# Patient Record
Sex: Female | Born: 1995 | Race: White | Hispanic: No | Marital: Married | Smoking: Never smoker
Health system: Southern US, Community
[De-identification: ages and names within clinical notes are randomized; demographics above are authoritative.]

## PROBLEM LIST (undated history)

## (undated) DIAGNOSIS — R519 Headache, unspecified: Secondary | ICD-10-CM

## (undated) HISTORY — PX: WISDOM TOOTH EXTRACTION: SHX21

---

## 2016-07-13 DIAGNOSIS — O200 Threatened abortion: Secondary | ICD-10-CM | POA: Diagnosis not present

## 2016-08-19 DIAGNOSIS — G43001 Migraine without aura, not intractable, with status migrainosus: Secondary | ICD-10-CM | POA: Insufficient documentation

## 2016-08-30 DIAGNOSIS — Z3689 Encounter for other specified antenatal screening: Secondary | ICD-10-CM | POA: Diagnosis not present

## 2016-10-17 DIAGNOSIS — H5203 Hypermetropia, bilateral: Secondary | ICD-10-CM | POA: Diagnosis not present

## 2018-05-13 DIAGNOSIS — O209 Hemorrhage in early pregnancy, unspecified: Secondary | ICD-10-CM | POA: Diagnosis not present

## 2018-05-13 DIAGNOSIS — Z3A01 Less than 8 weeks gestation of pregnancy: Secondary | ICD-10-CM | POA: Diagnosis not present

## 2018-05-13 DIAGNOSIS — R42 Dizziness and giddiness: Secondary | ICD-10-CM | POA: Diagnosis not present

## 2018-05-13 DIAGNOSIS — R1084 Generalized abdominal pain: Secondary | ICD-10-CM | POA: Diagnosis not present

## 2018-05-13 DIAGNOSIS — Z3201 Encounter for pregnancy test, result positive: Secondary | ICD-10-CM | POA: Diagnosis not present

## 2018-05-13 DIAGNOSIS — O26891 Other specified pregnancy related conditions, first trimester: Secondary | ICD-10-CM | POA: Diagnosis not present

## 2018-05-13 DIAGNOSIS — R5383 Other fatigue: Secondary | ICD-10-CM | POA: Diagnosis not present

## 2018-05-13 DIAGNOSIS — R110 Nausea: Secondary | ICD-10-CM | POA: Diagnosis not present

## 2018-05-13 DIAGNOSIS — O30041 Twin pregnancy, dichorionic/diamniotic, first trimester: Secondary | ICD-10-CM | POA: Diagnosis not present

## 2018-05-14 DIAGNOSIS — O219 Vomiting of pregnancy, unspecified: Secondary | ICD-10-CM | POA: Diagnosis not present

## 2018-05-14 DIAGNOSIS — R5383 Other fatigue: Secondary | ICD-10-CM | POA: Diagnosis not present

## 2018-05-14 DIAGNOSIS — Z3A01 Less than 8 weeks gestation of pregnancy: Secondary | ICD-10-CM | POA: Diagnosis not present

## 2018-05-14 DIAGNOSIS — O30049 Twin pregnancy, dichorionic/diamniotic, unspecified trimester: Secondary | ICD-10-CM | POA: Insufficient documentation

## 2018-05-14 DIAGNOSIS — O30041 Twin pregnancy, dichorionic/diamniotic, first trimester: Secondary | ICD-10-CM | POA: Diagnosis not present

## 2018-05-14 DIAGNOSIS — K5900 Constipation, unspecified: Secondary | ICD-10-CM | POA: Diagnosis not present

## 2018-05-14 DIAGNOSIS — R233 Spontaneous ecchymoses: Secondary | ICD-10-CM | POA: Diagnosis not present

## 2018-05-14 DIAGNOSIS — Z3490 Encounter for supervision of normal pregnancy, unspecified, unspecified trimester: Secondary | ICD-10-CM | POA: Insufficient documentation

## 2018-05-29 DIAGNOSIS — Z01419 Encounter for gynecological examination (general) (routine) without abnormal findings: Secondary | ICD-10-CM | POA: Diagnosis not present

## 2018-05-29 DIAGNOSIS — Z0000 Encounter for general adult medical examination without abnormal findings: Secondary | ICD-10-CM | POA: Diagnosis not present

## 2018-05-29 DIAGNOSIS — Z3A08 8 weeks gestation of pregnancy: Secondary | ICD-10-CM | POA: Diagnosis not present

## 2018-05-29 DIAGNOSIS — Z124 Encounter for screening for malignant neoplasm of cervix: Secondary | ICD-10-CM | POA: Diagnosis not present

## 2018-05-29 DIAGNOSIS — O3680X0 Pregnancy with inconclusive fetal viability, not applicable or unspecified: Secondary | ICD-10-CM | POA: Diagnosis not present

## 2018-05-29 DIAGNOSIS — Z3689 Encounter for other specified antenatal screening: Secondary | ICD-10-CM | POA: Diagnosis not present

## 2018-05-29 DIAGNOSIS — Z113 Encounter for screening for infections with a predominantly sexual mode of transmission: Secondary | ICD-10-CM | POA: Diagnosis not present

## 2018-05-29 DIAGNOSIS — O30041 Twin pregnancy, dichorionic/diamniotic, first trimester: Secondary | ICD-10-CM | POA: Diagnosis not present

## 2018-05-29 LAB — OB RESULTS CONSOLE HIV ANTIBODY (ROUTINE TESTING): HIV: NONREACTIVE

## 2018-05-29 LAB — OB RESULTS CONSOLE GC/CHLAMYDIA
Chlamydia: NEGATIVE
Gonorrhea: NEGATIVE

## 2018-05-29 LAB — OB RESULTS CONSOLE RUBELLA ANTIBODY, IGM: Rubella: IMMUNE

## 2018-05-29 LAB — OB RESULTS CONSOLE HEPATITIS B SURFACE ANTIGEN: Hepatitis B Surface Ag: NEGATIVE

## 2018-05-29 LAB — OB RESULTS CONSOLE ANTIBODY SCREEN: Antibody Screen: NEGATIVE

## 2018-05-29 LAB — OB RESULTS CONSOLE ABO/RH: RH Type: POSITIVE

## 2018-05-29 LAB — OB RESULTS CONSOLE RPR: RPR: NONREACTIVE

## 2018-05-29 LAB — OB RESULTS CONSOLE HGB/HCT, BLOOD: Hemoglobin: 11.2

## 2018-06-04 DIAGNOSIS — O209 Hemorrhage in early pregnancy, unspecified: Secondary | ICD-10-CM | POA: Diagnosis not present

## 2018-06-04 DIAGNOSIS — Z3A10 10 weeks gestation of pregnancy: Secondary | ICD-10-CM | POA: Diagnosis not present

## 2018-06-04 DIAGNOSIS — O200 Threatened abortion: Secondary | ICD-10-CM | POA: Diagnosis not present

## 2018-06-13 DIAGNOSIS — O418X10 Other specified disorders of amniotic fluid and membranes, first trimester, not applicable or unspecified: Secondary | ICD-10-CM | POA: Diagnosis not present

## 2018-06-13 DIAGNOSIS — O30041 Twin pregnancy, dichorionic/diamniotic, first trimester: Secondary | ICD-10-CM | POA: Diagnosis not present

## 2018-06-13 DIAGNOSIS — O418X11 Other specified disorders of amniotic fluid and membranes, first trimester, fetus 1: Secondary | ICD-10-CM | POA: Diagnosis not present

## 2018-06-13 DIAGNOSIS — Z3A10 10 weeks gestation of pregnancy: Secondary | ICD-10-CM | POA: Diagnosis not present

## 2018-06-13 DIAGNOSIS — O3680X1 Pregnancy with inconclusive fetal viability, fetus 1: Secondary | ICD-10-CM | POA: Diagnosis not present

## 2018-06-13 DIAGNOSIS — O3680X0 Pregnancy with inconclusive fetal viability, not applicable or unspecified: Secondary | ICD-10-CM | POA: Diagnosis not present

## 2018-06-13 DIAGNOSIS — O468X1 Other antepartum hemorrhage, first trimester: Secondary | ICD-10-CM | POA: Diagnosis not present

## 2018-06-13 DIAGNOSIS — O4441 Low lying placenta NOS or without hemorrhage, first trimester: Secondary | ICD-10-CM | POA: Diagnosis not present

## 2018-06-26 DIAGNOSIS — O418X11 Other specified disorders of amniotic fluid and membranes, first trimester, fetus 1: Secondary | ICD-10-CM | POA: Diagnosis not present

## 2018-06-26 DIAGNOSIS — O30041 Twin pregnancy, dichorionic/diamniotic, first trimester: Secondary | ICD-10-CM | POA: Diagnosis not present

## 2018-06-26 DIAGNOSIS — O468X1 Other antepartum hemorrhage, first trimester: Secondary | ICD-10-CM | POA: Diagnosis not present

## 2018-06-26 DIAGNOSIS — Z3A12 12 weeks gestation of pregnancy: Secondary | ICD-10-CM | POA: Diagnosis not present

## 2018-06-26 DIAGNOSIS — Z3682 Encounter for antenatal screening for nuchal translucency: Secondary | ICD-10-CM | POA: Diagnosis not present

## 2018-07-25 DIAGNOSIS — M545 Low back pain: Secondary | ICD-10-CM | POA: Diagnosis not present

## 2018-07-25 DIAGNOSIS — Z3689 Encounter for other specified antenatal screening: Secondary | ICD-10-CM | POA: Diagnosis not present

## 2018-07-25 DIAGNOSIS — O30042 Twin pregnancy, dichorionic/diamniotic, second trimester: Secondary | ICD-10-CM | POA: Diagnosis not present

## 2018-07-25 DIAGNOSIS — Z3A17 17 weeks gestation of pregnancy: Secondary | ICD-10-CM | POA: Diagnosis not present

## 2018-08-07 DIAGNOSIS — Z3A18 18 weeks gestation of pregnancy: Secondary | ICD-10-CM | POA: Diagnosis not present

## 2018-08-07 DIAGNOSIS — O30042 Twin pregnancy, dichorionic/diamniotic, second trimester: Secondary | ICD-10-CM | POA: Diagnosis not present

## 2018-08-07 DIAGNOSIS — R310 Gross hematuria: Secondary | ICD-10-CM | POA: Diagnosis not present

## 2018-08-07 DIAGNOSIS — O9989 Other specified diseases and conditions complicating pregnancy, childbirth and the puerperium: Secondary | ICD-10-CM | POA: Diagnosis not present

## 2018-08-07 DIAGNOSIS — Z3689 Encounter for other specified antenatal screening: Secondary | ICD-10-CM | POA: Diagnosis not present

## 2018-08-24 ENCOUNTER — Encounter: Payer: Self-pay | Admitting: Family Medicine

## 2018-08-24 ENCOUNTER — Ambulatory Visit (INDEPENDENT_AMBULATORY_CARE_PROVIDER_SITE_OTHER): Payer: Medicaid Other | Admitting: Family Medicine

## 2018-08-24 ENCOUNTER — Other Ambulatory Visit: Payer: Self-pay

## 2018-08-24 VITALS — BP 113/60 | HR 97 | Ht 65.0 in | Wt 146.0 lb

## 2018-08-24 DIAGNOSIS — O30042 Twin pregnancy, dichorionic/diamniotic, second trimester: Secondary | ICD-10-CM

## 2018-08-24 DIAGNOSIS — Z3A21 21 weeks gestation of pregnancy: Secondary | ICD-10-CM

## 2018-08-24 DIAGNOSIS — O0990 Supervision of high risk pregnancy, unspecified, unspecified trimester: Secondary | ICD-10-CM

## 2018-08-24 DIAGNOSIS — O30049 Twin pregnancy, dichorionic/diamniotic, unspecified trimester: Secondary | ICD-10-CM

## 2018-08-24 MED ORDER — ASPIRIN EC 81 MG PO TBEC: 81.0000 mg | DELAYED_RELEASE_TABLET | Freq: Every day | ORAL | 2 refills | Status: DC

## 2018-08-24 NOTE — Patient Instructions (Addendum)
Dietary Recommendations in Pregnancy:      Twins:      Non-pregnant Single Baby  Underweight (BMI < 19.8) Normal Weight (BMI 20 -26) Overweight (BMI 26-29) Obese (BMI >29)  (BMI >29)         Calories (Kcal) 2200 2500  4000 3500 3250 3000  Protein (g) 110 126  200 175 163 150  Carbohydrate (g) 220 248  400 350 325 300  Fat (g) 98 112  178 156 144 133       Twin Dietary Recommendations:  . Eat every 3-4 hours: at least 3 meals plus 2-3 snacks per day . Iron supplement twice a day and lean protein: fish, poultry, dairy, nuts . Complex carbohydrates: fruits, legumes, and whole grains . Daily mineral supplements: Calcium 3g, Magnesium 1.2g, Zinc 45mg . Healthy fats: mono and poly unsaturated (olive oil, canola oil, nuts, seeds). Limit saturated fats and trans fats . Omega-3 fatty acid rich fish protein 2-4x per week . BMI specific dietary recommendations for calories and protein  

## 2018-08-24 NOTE — Progress Notes (Signed)
Subjective:  Danielle Crawford is a G2P1001 640w0d being seen today for her first obstetrical visit in our office. She transferred care from [facility]. Her first pregnancy and labor was uncomplicated - she delivered at 39 weeks at [facility] without complications. She is currently expecting twins. The first part of her pregnancy was complicated by subchorionic hematoma. FOB involved and is patient's husband. Patient does intend to breast feed. Pregnancy history fully reviewed.  Patient reports no complaints.  BP 113/60   Pulse 97   Ht 5\' 5"  (1.651 m)   Wt 146 lb (66.2 kg)   LMP 03/30/2018 (Exact Date)   BMI 24.30 kg/m   HISTORY: OB History  Gravida Para Term Preterm AB Living  2 1 1     1   SAB TAB Ectopic Multiple Live Births        1 1    # Outcome Date GA Lbr Len/2nd Weight Sex Delivery Anes PTL Lv  2 Current           1 Term 02/19/17 7880w0d  8 lb 3 oz (3.714 kg) M    LIV    History reviewed. No pertinent past medical history.  History reviewed. No pertinent surgical history.  Family History  Problem Relation Age of Onset  . Cancer Other        breast- great grandmother  . Diabetes Neg Hx   . Hypertension Neg Hx      Exam  BP 113/60   Pulse 97   Ht 5\' 5"  (1.651 m)   Wt 146 lb (66.2 kg)   LMP 03/30/2018 (Exact Date)   BMI 24.30 kg/m   CONSTITUTIONAL: Well-developed, well-nourished female in no acute distress.  HENT:  Normocephalic, atraumatic, External right and left ear normal. Oropharynx is clear and moist EYES: Conjunctivae and EOM are normal. Pupils are equal, round, and reactive to light. No scleral icterus.  NECK: Normal range of motion, supple, no masses.  Normal thyroid.  CARDIOVASCULAR: Normal heart rate noted, regular rhythm RESPIRATORY: Clear to auscultation bilaterally. Effort and breath sounds normal, no problems with respiration noted. ABDOMEN: Soft, normal bowel sounds, no distention noted.  No tenderness, rebound or guarding.   MUSCULOSKELETAL: Normal range of motion. No tenderness.  No cyanosis, clubbing, or edema.  2+ distal pulses. SKIN: Skin is warm and dry. No rash noted. Not diaphoretic. No erythema. No pallor. NEUROLOGIC: Alert and oriented to person, place, and time. Normal reflexes, muscle tone coordination. No cranial nerve deficit noted. PSYCHIATRIC: Normal mood and affect. Normal behavior. Normal judgment and thought content.    Assessment:    Pregnancy: G2P1001 Patient Active Problem List   Diagnosis Date Noted  . Dichorionic diamniotic twin pregnancy, antepartum 08/24/2018  . Supervision of high risk pregnancy, antepartum 08/24/2018      Plan:   1. Supervision of high risk pregnancy, antepartum FHT and FH normal Discussed nature of practice, call schedule, etc Discussed use of midwives and fellows. Low risk NIPS Reviewed PNL - normal  2. Dichorionic diamniotic twin pregnancy, antepartum Start ASA 81mg  Discussed nutrition in twin pregnancy: 3500kcal per day with about 175g protein.  Will schedule US. Delivery of uncomplicated Di/di twins between 37-38 weeks discussed, as well as antenatal testing starting at 35 weeks.  Patient desirous of minimal invasive labor - c/s as last option, delayed cord clamping, skin to skin, etc. I discussed with her that there are many of our OB providers who are comfortable with breech extraction as alternative to  cesarean delivery for breech or transverse baby B.  - [place] MFM OB DETAIL +14 WK; Future - [place] MFM OB DETAIL ADDL GEST +14 WK; Future      Problem list reviewed and updated. 75% of 45 min visit spent on counseling and coordination of care.     Levie Heritage 08/24/2018

## 2018-08-29 ENCOUNTER — Encounter (HOSPITAL_COMMUNITY): Payer: Self-pay

## 2018-08-29 ENCOUNTER — Ambulatory Visit (HOSPITAL_COMMUNITY): Payer: Medicaid Other | Admitting: *Deleted

## 2018-08-29 ENCOUNTER — Ambulatory Visit (HOSPITAL_COMMUNITY)
Admission: RE | Admit: 2018-08-29 | Discharge: 2018-08-29 | Disposition: A | Discharge status: Home / Self Care | Payer: Medicaid Other | Source: Ambulatory Visit | Attending: Obstetrics and Gynecology | Admitting: Obstetrics and Gynecology

## 2018-08-29 ENCOUNTER — Other Ambulatory Visit: Payer: Self-pay

## 2018-08-29 VITALS — BP 96/60 | HR 83 | Temp 97.8°F

## 2018-08-29 DIAGNOSIS — Z363 Encounter for antenatal screening for malformations: Secondary | ICD-10-CM | POA: Diagnosis not present

## 2018-08-29 DIAGNOSIS — Z3A21 21 weeks gestation of pregnancy: Secondary | ICD-10-CM

## 2018-08-29 DIAGNOSIS — O30049 Twin pregnancy, dichorionic/diamniotic, unspecified trimester: Secondary | ICD-10-CM

## 2018-08-29 DIAGNOSIS — O30042 Twin pregnancy, dichorionic/diamniotic, second trimester: Secondary | ICD-10-CM | POA: Diagnosis not present

## 2018-08-30 ENCOUNTER — Encounter: Payer: Self-pay | Admitting: Advanced Practice Midwife

## 2018-08-30 ENCOUNTER — Other Ambulatory Visit (HOSPITAL_COMMUNITY): Payer: Self-pay | Admitting: *Deleted

## 2018-08-30 DIAGNOSIS — Z362 Encounter for other antenatal screening follow-up: Secondary | ICD-10-CM

## 2018-08-30 DIAGNOSIS — O30042 Twin pregnancy, dichorionic/diamniotic, second trimester: Secondary | ICD-10-CM

## 2018-09-05 ENCOUNTER — Encounter: Payer: Self-pay | Admitting: Obstetrics & Gynecology

## 2018-09-25 ENCOUNTER — Ambulatory Visit (INDEPENDENT_AMBULATORY_CARE_PROVIDER_SITE_OTHER): Payer: Medicaid Other | Admitting: Advanced Practice Midwife

## 2018-09-25 ENCOUNTER — Other Ambulatory Visit: Payer: Self-pay

## 2018-09-25 ENCOUNTER — Ambulatory Visit (HOSPITAL_COMMUNITY): Payer: Medicaid Other | Admitting: *Deleted

## 2018-09-25 ENCOUNTER — Ambulatory Visit (HOSPITAL_COMMUNITY)
Admission: RE | Admit: 2018-09-25 | Discharge: 2018-09-25 | Disposition: A | Discharge status: Home / Self Care | Payer: Medicaid Other | Source: Ambulatory Visit | Attending: Obstetrics and Gynecology | Admitting: Obstetrics and Gynecology

## 2018-09-25 ENCOUNTER — Encounter (HOSPITAL_COMMUNITY): Payer: Self-pay | Admitting: *Deleted

## 2018-09-25 ENCOUNTER — Encounter: Payer: Self-pay | Admitting: Advanced Practice Midwife

## 2018-09-25 ENCOUNTER — Other Ambulatory Visit (HOSPITAL_COMMUNITY): Payer: Self-pay | Admitting: *Deleted

## 2018-09-25 VITALS — BP 100/56 | HR 72 | Temp 98.5°F

## 2018-09-25 DIAGNOSIS — O30049 Twin pregnancy, dichorionic/diamniotic, unspecified trimester: Secondary | ICD-10-CM | POA: Insufficient documentation

## 2018-09-25 DIAGNOSIS — O30042 Twin pregnancy, dichorionic/diamniotic, second trimester: Secondary | ICD-10-CM | POA: Insufficient documentation

## 2018-09-25 DIAGNOSIS — Z3A25 25 weeks gestation of pregnancy: Secondary | ICD-10-CM | POA: Diagnosis not present

## 2018-09-25 DIAGNOSIS — Z362 Encounter for other antenatal screening follow-up: Secondary | ICD-10-CM | POA: Diagnosis not present

## 2018-09-25 DIAGNOSIS — O30043 Twin pregnancy, dichorionic/diamniotic, third trimester: Secondary | ICD-10-CM

## 2018-09-25 DIAGNOSIS — O0992 Supervision of high risk pregnancy, unspecified, second trimester: Secondary | ICD-10-CM

## 2018-09-25 DIAGNOSIS — O0990 Supervision of high risk pregnancy, unspecified, unspecified trimester: Secondary | ICD-10-CM

## 2018-09-25 NOTE — Progress Notes (Deleted)
  Subjective:    Danielle Crawford is being seen today for her first obstetrical visit.  This {is/is not:9024} a planned pregnancy. She is at [redacted]w[redacted]d gestation. Her obstetrical history is significant for {ob risk factors:10154}. Relationship with FOB: {fob:16621}. Patient {does/does not:19097} intend to breast feed. Pregnancy history fully reviewed.  Patient reports {sx:14538}.  Review of Systems:   Review of Systems  Objective:     BP (!) 107/54   Pulse 94   Wt 69 kg   LMP 03/30/2018 (Exact Date)   BMI 25.31 kg/m  Physical Exam  Exam    Assessment:    Pregnancy: G2P1001 Patient Active Problem List   Diagnosis Date Noted  . Dichorionic diamniotic twin pregnancy, antepartum 08/24/2018  . Supervision of high risk pregnancy, antepartum 08/24/2018       Plan:     Initial labs drawn. Prenatal vitamins. Problem list reviewed and updated. AFP3 discussed: {requests/ordered/declines:14581}. Role of ultrasound in pregnancy discussed; fetal survey: {requests/ordered/declines:14581}. Amniocentesis discussed: {amniocentesis:14582}. Follow up in {numbers 0-4:31231} weeks. ***% of *** min visit spent on counseling and coordination of care.  ***   Hansel Feinstein 09/25/2018

## 2018-09-25 NOTE — Progress Notes (Deleted)
  Subjective:    Danielle Crawford is being seen today for her first obstetrical visit.  This {is/is not:9024} a planned pregnancy. She is at [redacted]w[redacted]d gestation. Her obstetrical history is significant for {ob risk factors:10154}. Relationship with FOB: {fob:16621}. Patient {does/does not:19097} intend to breast feed. Pregnancy history fully reviewed.  Patient reports {sx:14538}.  Review of Systems:   Review of Systems  Objective:     BP (!) 107/54   Pulse 94   Wt 69 kg   LMP 03/30/2018 (Exact Date)   BMI 25.31 kg/m  Physical Exam  Exam    Assessment:    Pregnancy: G2P1001 Patient Active Problem List   Diagnosis Date Noted  . Dichorionic diamniotic twin pregnancy, antepartum 08/24/2018  . Supervision of high risk pregnancy, antepartum 08/24/2018       Plan:     Initial labs drawn. Prenatal vitamins. Problem list reviewed and updated. AFP3 discussed: {requests/ordered/declines:14581}. Role of ultrasound in pregnancy discussed; fetal survey: {requests/ordered/declines:14581}. Amniocentesis discussed: {amniocentesis:14582}. Follow up in {numbers 0-4:31231} weeks. ***% of *** min visit spent on counseling and coordination of care.  ***   Marie Williams 09/25/2018   

## 2018-09-25 NOTE — Patient Instructions (Signed)
Glucose Tolerance Test During Pregnancy Why am I having this test? The glucose tolerance test (GTT) is done to check how your body processes sugar (glucose). This is one of several tests used to diagnose diabetes that develops during pregnancy (gestational diabetes mellitus). Gestational diabetes is a temporary form of diabetes that some women develop during pregnancy. It usually occurs during the second trimester of pregnancy and goes away after delivery. Testing (screening) for gestational diabetes usually occurs between 24 and 28 weeks of pregnancy. You may have the GTT test after having a 1-hour glucose screening test if the results from that test indicate that you may have gestational diabetes. You may also have this test if:  You have a history of gestational diabetes.  You have a history of giving birth to very large babies or have experienced repeated fetal loss (stillbirth).  You have signs and symptoms of diabetes, such as: ? Changes in your vision. ? Tingling or numbness in your hands or feet. ? Changes in hunger, thirst, and urination that are not otherwise explained by your pregnancy. What is being tested? This test measures the amount of glucose in your blood at different times during a period of 3 hours. This indicates how well your body is able to process glucose. What kind of sample is taken?  Blood samples are required for this test. They are usually collected by inserting a needle into a blood vessel. How do I prepare for this test?  For 3 days before your test, eat normally. Have plenty of carbohydrate-rich foods.  Follow instructions from your health care provider about: ? Eating or drinking restrictions on the day of the test. You may be asked to not eat or drink anything other than water (fast) starting 8-10 hours before the test. ? Changing or stopping your regular medicines. Some medicines may interfere with this test. Tell a health care provider about:  All  medicines you are taking, including vitamins, herbs, eye drops, creams, and over-the-counter medicines.  Any blood disorders you have.  Any surgeries you have had.  Any medical conditions you have. What happens during the test? First, your blood glucose will be measured. This is referred to as your fasting blood glucose, since you fasted before the test. Then, you will drink a glucose solution that contains a certain amount of glucose. Your blood glucose will be measured again 1, 2, and 3 hours after drinking the solution. This test takes about 3 hours to complete. You will need to stay at the testing location during this time. During the testing period:  Do not eat or drink anything other than the glucose solution.  Do not exercise.  Do not use any products that contain nicotine or tobacco, such as cigarettes and e-cigarettes. If you need help stopping, ask your health care provider. The testing procedure may vary among health care providers and hospitals. How are the results reported? Your results will be reported as milligrams of glucose per deciliter of blood (mg/dL) or millimoles per liter (mmol/L). Your health care provider will compare your results to normal ranges that were established after testing a large group of people (reference ranges). Reference ranges may vary among labs and hospitals. For this test, common reference ranges are:  Fasting: less than 95-105 mg/dL (5.3-5.8 mmol/L).  1 hour after drinking glucose: less than 180-190 mg/dL (10.0-10.5 mmol/L).  2 hours after drinking glucose: less than 155-165 mg/dL (8.6-9.2 mmol/L).  3 hours after drinking glucose: 140-145 mg/dL (7.8-8.1 mmol/L). What do the   results mean? Results within reference ranges are considered normal, meaning that your glucose levels are well-controlled. If two or more of your blood glucose levels are high, you may be diagnosed with gestational diabetes. If only one level is high, your health care  provider may suggest repeat testing or other tests to confirm a diagnosis. Talk with your health care provider about what your results mean. Questions to ask your health care provider Ask your health care provider, or the department that is doing the test:  When will my results be ready?  How will I get my results?  What are my treatment options?  What other tests do I need?  What are my next steps? Summary  The glucose tolerance test (GTT) is one of several tests used to diagnose diabetes that develops during pregnancy (gestational diabetes mellitus). Gestational diabetes is a temporary form of diabetes that some women develop during pregnancy.  You may have the GTT test after having a 1-hour glucose screening test if the results from that test indicate that you may have gestational diabetes. You may also have this test if you have any symptoms or risk factors for gestational diabetes.  Talk with your health care provider about what your results mean. This information is not intended to replace advice given to you by your health care provider. Make sure you discuss any questions you have with your health care provider. Document Released: 09/06/2011 Document Revised: 06/28/2018 Document Reviewed: 10/17/2016 Elsevier Patient Education  2020 Elsevier Inc.  

## 2018-09-26 ENCOUNTER — Other Ambulatory Visit (HOSPITAL_COMMUNITY): Payer: Self-pay | Admitting: *Deleted

## 2018-09-26 DIAGNOSIS — O30049 Twin pregnancy, dichorionic/diamniotic, unspecified trimester: Secondary | ICD-10-CM

## 2018-09-27 ENCOUNTER — Encounter: Payer: Self-pay | Admitting: Advanced Practice Midwife

## 2018-09-27 NOTE — Progress Notes (Deleted)
  Subjective:    Danielle Crawford is being seen today for her first obstetrical visit.  This {is/is not:9024} a planned pregnancy. She is at [redacted]w[redacted]d gestation. Her obstetrical history is significant for {ob risk factors:10154}. Relationship with FOB: {fob:16621}. Patient {does/does not:19097} intend to breast feed. Pregnancy history fully reviewed.  Patient reports {sx:14538}.  Review of Systems:   Review of Systems  Objective:     BP (!) 107/54   Pulse 94   Wt 69 kg   LMP 03/30/2018 (Exact Date)   BMI 25.31 kg/m  Physical Exam  Exam    Assessment:    Pregnancy: G2P1001 Patient Active Problem List   Diagnosis Date Noted  . Dichorionic diamniotic twin gestation 05/14/2018  . Pregnancy 05/14/2018  . Migraine without aura and with status migrainosus, not intractable 08/19/2016       Plan:     Initial labs drawn. Prenatal vitamins. Problem list reviewed and updated. AFP3 discussed: {requests/ordered/declines:14581}. Role of ultrasound in pregnancy discussed; fetal survey: {requests/ordered/declines:14581}. Amniocentesis discussed: {amniocentesis:14582}. Follow up in {numbers 0-4:31231} weeks. ***% of *** min visit spent on counseling and coordination of care.  ***   Hansel Feinstein 09/27/2018

## 2018-09-27 NOTE — Progress Notes (Signed)
   PRENATAL VISIT NOTE  Subjective:  Danielle Crawford is a 23 y.o. G2P1001 at [redacted]w[redacted]d being seen today for ongoing prenatal care.  She is currently monitored for the following issues for this high-risk pregnancy and has Dichorionic diamniotic twin gestation; Pregnancy; and Migraine without aura and with status migrainosus, not intractable on their problem list.  Patient reports no complaints.  Contractions: Not present. Vag. Bleeding: None.  Movement: Present. Denies leaking of fluid.   The following portions of the patient's history were reviewed and updated as appropriate: allergies, current medications, past family history, past medical history, past social history, past surgical history and problem list.   Objective:   Vitals:   09/25/18 0839  BP: (!) 107/54  Pulse: 94  Weight: 69 kg    Fetal Status: Fetal Heart Rate (bpm): 133/145 Fundal Height: 34 cm Movement: Present     General:  Alert, oriented and cooperative. Patient is in no acute distress.  Skin: Skin is warm and dry. No rash noted.   Cardiovascular: Normal heart rate noted  Respiratory: Normal respiratory effort, no problems with respiration noted  Abdomen: Soft, gravid, appropriate for gestational age.  Pain/Pressure: Present     Pelvic: Cervical exam deferred        Extremities: Normal range of motion.  Edema: None  Mental Status: Normal mood and affect. Normal behavior. Normal judgment and thought content.   Assessment and Plan:  Pregnancy: G2P1001 at [redacted]w[redacted]d 1. Supervision of high risk pregnancy, antepartum    Reviewed signs of preterm labor, PPROM and bleeding.  Reviewed Fetal movement   Reviewed how to go to MAU    Has [place] scheduled today    Can't stay for glucola today due to work     Will reschedule this week or next  Preterm labor symptoms and general obstetric precautions including but not limited to vaginal bleeding, contractions, leaking of fluid and fetal movement were reviewed in detail with the  patient. Please refer to After Visit Summary for other counseling recommendations.   Return for [facility].  Future Appointments  Date Time Provider [place]  10/24/2018  3:45 PM Lavonia Drafts, MD [facility] None  10/30/2018  1:00 PM [place] [facility]  10/30/2018  1:00 PM [place] 3 [facility]  11/27/2018  1:00 PM [place] [facility]  11/27/2018  1:00 PM [place] 3 [facility]    Hansel Feinstein, CNM

## 2018-10-04 ENCOUNTER — Encounter (HOSPITAL_COMMUNITY): Payer: Self-pay

## 2018-10-04 ENCOUNTER — Inpatient Hospital Stay (HOSPITAL_BASED_OUTPATIENT_CLINIC_OR_DEPARTMENT_OTHER): Payer: Medicaid Other

## 2018-10-04 ENCOUNTER — Other Ambulatory Visit: Payer: Self-pay

## 2018-10-04 ENCOUNTER — Inpatient Hospital Stay (HOSPITAL_COMMUNITY)
Admission: AD | Admit: 2018-10-04 | Discharge: 2018-10-04 | Disposition: A | Discharge status: Home / Self Care | Payer: Medicaid Other | Attending: Obstetrics and Gynecology | Admitting: Obstetrics and Gynecology

## 2018-10-04 DIAGNOSIS — Z7982 Long term (current) use of aspirin: Secondary | ICD-10-CM | POA: Insufficient documentation

## 2018-10-04 DIAGNOSIS — R109 Unspecified abdominal pain: Secondary | ICD-10-CM | POA: Insufficient documentation

## 2018-10-04 DIAGNOSIS — Z3A27 27 weeks gestation of pregnancy: Secondary | ICD-10-CM | POA: Diagnosis not present

## 2018-10-04 DIAGNOSIS — Z3A26 26 weeks gestation of pregnancy: Secondary | ICD-10-CM

## 2018-10-04 DIAGNOSIS — O368120 Decreased fetal movements, second trimester, not applicable or unspecified: Secondary | ICD-10-CM

## 2018-10-04 DIAGNOSIS — R1030 Lower abdominal pain, unspecified: Secondary | ICD-10-CM | POA: Diagnosis not present

## 2018-10-04 DIAGNOSIS — O30042 Twin pregnancy, dichorionic/diamniotic, second trimester: Secondary | ICD-10-CM | POA: Insufficient documentation

## 2018-10-04 DIAGNOSIS — O26892 Other specified pregnancy related conditions, second trimester: Secondary | ICD-10-CM

## 2018-10-04 HISTORY — DX: Headache, unspecified: R51.9

## 2018-10-04 LAB — URINALYSIS, ROUTINE W REFLEX MICROSCOPIC
Bilirubin Urine: NEGATIVE
Glucose, UA: NEGATIVE mg/dL
Hgb urine dipstick: NEGATIVE
Ketones, ur: NEGATIVE mg/dL
Nitrite: NEGATIVE
Protein, ur: NEGATIVE mg/dL
Specific Gravity, Urine: 1.008 (ref 1.005–1.030)
pH: 7 (ref 5.0–8.0)

## 2018-10-04 NOTE — MAU Note (Signed)
PT c/o of sharp pains in her abdomen when moving. Also having BH cntx and feels decreased fetal movement. Has no LOF or VB.  Has [employer] twin pregnancy.

## 2018-10-04 NOTE — MAU Note (Signed)
Pt had U/S d/t decreased FM. But these babies were  very difficult to trace d/t fetal movement. Was unable to get 20 min NST because patient had to leave to pick up her child.  Provider aware and discharged patient with labor precautions.

## 2018-10-04 NOTE — MAU Provider Note (Addendum)
Patient Danielle Crawford is a 23 y.o. G2P1001 At 5024w0d here with complaints of sharp pains in her suprapubic region since yesterday and and decreased fetal movement. She can't tell if she is having contractions or just gas pain. She denies VB, abnormal vaginal discharge, dysuria, flank pain,  LOF, headache, blurry vision, SOB, fever, or other ob-gyn complaints. She is pregnant with Di-Di twins; so far, she has had no complications with her twins. [facility] .    Her next prenatal visit is the 11th of August; History     CSN: [id number]  Arrival date and time: 10/04/18 1428   None     Chief Complaint  Patient presents with  . Contractions   Abdominal Pain This is a new problem. The current episode started yesterday. The onset quality is sudden. The pain is located in the suprapubic region. The pain is at a severity of 3/10. The quality of the pain is sharp. The abdominal pain does not radiate. Associated symptoms include nausea. Pertinent negatives include no constipation or diarrhea.  The pain gets better when she stands up and stretches. In addition, she feels like baby A has been moving less; she feels them "balling up" and can't tell if that is normal.   OB History    Gravida  2   Para  1   Term  1   Preterm      AB      Living  1     SAB      TAB      Ectopic      Multiple  1   Live Births  1           Past Medical History:  Diagnosis Date  . Headache     Past Surgical History:  Procedure Laterality Date  . WISDOM TOOTH EXTRACTION      Family History  Problem Relation Age of Onset  . Cancer Other        breast- great grandmother  . Diabetes Neg Hx   . Hypertension Neg Hx     Social History   Tobacco Use  . Smoking status: Never Smoker  . Smokeless tobacco: Never Used  Substance Use Topics  . Alcohol use: Never    Frequency: Never  . Drug use: Never    Allergies: No Known Allergies  Medications Prior to  Admission  Medication Sig Dispense Refill Last Dose  . aspirin EC 81 MG tablet Take 1 tablet (81 mg total) by mouth daily. Take after 12 weeks for prevention of preeclampsia later in pregnancy 300 tablet 2 10/04/2018 at Unknown time  . Prenatal Vit w/Fe-Methylfol-FA (PNV PO) Take by mouth.   10/04/2018 at Unknown time  . DICLEGIS 10-10 MG TBEC TAKE 1 TABLET EVERY MORNING MAY TAKE 1 ADDITIONAL TAB AT LUNCH IF NEEDED AND 2 TABS AT BEDTIME    at not taking  . Doxylamine-Pyridoxine 10-10 MG TBEC Take by mouth.    at not taking  . Prenat w/o A Vit-FeFum-FePo-FA (CONCEPT OB) 130-92.4-1 MG CAPS Take 1 tablet by mouth daily.     . Prenatal Vit-Fe Fumarate-FA (PRENATAL VITAMIN) 27-0.8 MG TABS Take by mouth.       Review of Systems  Constitutional: Negative.   HENT: Negative.   Eyes: Negative.   Respiratory: Negative.   Gastrointestinal: Positive for abdominal pain and nausea. Negative for constipation and diarrhea.  Endocrine: Negative.   Genitourinary: Negative for difficulty urinating.  Neurological: Negative.  Physical Exam   Blood pressure (!) 88/48, pulse 92, temperature 97.6 F (36.4 C), temperature source Oral, resp. rate 16, height 5\' 5"  (1.651 m), weight 71.4 kg, last menstrual period 03/30/2018, SpO2 98 %.  Physical Exam  Constitutional: She appears well-developed and well-nourished.  HENT:  Head: Normocephalic.  Neck: Normal range of motion.  GI: Soft.  Musculoskeletal: Normal range of motion.  Neurological: She is alert.  Skin: Skin is warm.    MAU Course  Procedures  MDM -Limited US shows active fetal movements with normal amniotic fluid, Baby A HR of 144 and Baby B heart rate above 160 -UA for culture, although unlikely she has UTI.  -Patient declines wet prep/GC  and vaginal exam  NST; Baby A HR of 155, Baby B heart rate of 145; present acel, neg decels, no contractions, moderate variability.  Assessment and Plan   1. Pregnancy with suprapubic pain, antepartum     2. Urine for culture, although explained that her suprapubic pain sounds MSK due to increasing gestation and twin pregnancy.   3. Return precautions given; keep follow up appts at [facility].   4. Reviewed fetal movements may be different at this gestation and they may change as babies shift position. Please return to MAU if she feels baby's are not moving like normal.   5. Patient desires discharge as soon as possible to pick up her son and so declined VE or vaginal swabs.   Mervyn Skeeters Abdulrahim Siddiqi 10/04/2018, 4:33 PM

## 2018-10-04 NOTE — Discharge Instructions (Signed)
Pregnancy and Urinary Tract Infection ° °A urinary tract infection (UTI) is an infection of any part of the urinary tract. This includes the kidneys, the tubes that connect your kidneys to your bladder (ureters), the bladder, and the tube that carries urine out of your body (urethra). These organs make, store, and get rid of urine in the body. Your health care provider may use other names to describe the infection. An upper UTI affects the ureters and kidneys (pyelonephritis). A lower UTI affects the bladder (cystitis) and urethra (urethritis). °Most urinary tract infections are caused by bacteria in your genital area, around the entrance to your urinary tract (urethra). These bacteria grow and cause irritation and inflammation of your urinary tract. You are more likely to develop a UTI during pregnancy because the physical and hormonal changes your body goes through can make it easier for bacteria to get into your urinary tract. Your growing baby also puts pressure on your bladder and can affect urine flow. It is important to recognize and treat UTIs in pregnancy because of the risk of serious complications for both you and your baby. °How does this affect me? °Symptoms of a UTI include: °· Needing to urinate right away (urgently). °· Frequent urination or passing small amounts of urine frequently. °· Pain or burning with urination. °· Blood in the urine. °· Urine that smells bad or unusual. °· Trouble urinating. °· Cloudy urine. °· Pain in the abdomen or lower back. °· Vaginal discharge. °You may also have: °· Vomiting or a decreased appetite. °· Confusion. °· Irritability or tiredness. °· A fever. °· Diarrhea. °How does this affect my baby? °An untreated UTI during pregnancy could lead to a kidney infection or a systemic infection, which can cause health problems that could affect your baby. Possible complications of an untreated UTI include: °· Giving birth to your baby before 37 weeks of pregnancy  (premature). °· Having a baby with a low birth weight. °· Developing high blood pressure during pregnancy (preeclampsia). °· Having a low hemoglobin level (anemia). °What can I do to lower my risk? °To prevent a UTI: °· Go to the bathroom as soon as you feel the need. Do not hold urine for long periods of time. °· Always wipe from front to back, especially after a bowel movement. Use each tissue one time when you wipe. °· Empty your bladder after sex. °· Keep your genital area dry. °· Drink 6-10 glasses of water each day. °· Do not douche or use deodorant sprays. °How is this treated? °Treatment for this condition may include: °· Antibiotic medicines that are safe to take during pregnancy. °· Other medicines to treat less common causes of UTI. °Follow these instructions at home: °· If you were prescribed an antibiotic medicine, take it as told by your health care provider. Do not stop using the antibiotic even if you start to feel better. °· Keep all follow-up visits as told by your health care provider. This is important. °Contact a health care provider if: °· Your symptoms do not improve or they get worse. °· You have abnormal vaginal discharge. °Get help right away if you: °· Have a fever. °· Have nausea and vomiting. °· Have back or side pain. °· Feel contractions in your uterus. °· Have lower belly pain. °· Have a gush of fluid from your vagina. °· Have blood in your urine. °Summary °· A urinary tract infection (UTI) is an infection of any part of the urinary tract, which includes the   kidneys, ureters, bladder, and urethra. °· Most urinary tract infections are caused by bacteria in your genital area, around the entrance to your urinary tract (urethra). °· You are more likely to develop a UTI during pregnancy. °· If you were prescribed an antibiotic medicine, take it as told by your health care provider. Do not stop using the antibiotic even if you start to feel better. °This information is not intended to  replace advice given to you by your health care provider. Make sure you discuss any questions you have with your health care provider. °Document Released: 07/02/2010 Document Revised: 06/29/2018 Document Reviewed: 02/08/2018 °Elsevier Patient Education © 2020 [employer] Inc. ° °

## 2018-10-06 LAB — CULTURE, OB URINE: Culture: NO GROWTH

## 2018-10-12 ENCOUNTER — Other Ambulatory Visit: Payer: Medicaid Other

## 2018-10-12 ENCOUNTER — Other Ambulatory Visit: Payer: Self-pay

## 2018-10-12 DIAGNOSIS — O30049 Twin pregnancy, dichorionic/diamniotic, unspecified trimester: Secondary | ICD-10-CM | POA: Diagnosis not present

## 2018-10-12 NOTE — Progress Notes (Signed)
Patient completing 28 week labs- did jelly bean glucose testing. Kathrene Alu RN Tdap offered- patient refused.

## 2018-10-13 LAB — CBC

## 2018-10-13 LAB — GLUCOSE TOLERANCE, 1 HOUR: Glucose, 1Hr PP: 89 mg/dL (ref 65–199)

## 2018-10-13 LAB — RPR: RPR Ser Ql: NONREACTIVE

## 2018-10-13 LAB — HIV ANTIBODY (ROUTINE TESTING W REFLEX): HIV Screen 4th Generation wRfx: NONREACTIVE

## 2018-10-24 ENCOUNTER — Encounter: Payer: Medicaid Other | Admitting: Obstetrics & Gynecology

## 2018-10-30 ENCOUNTER — Ambulatory Visit (HOSPITAL_COMMUNITY): Payer: Medicaid Other

## 2018-10-30 ENCOUNTER — Encounter (HOSPITAL_COMMUNITY): Payer: Self-pay

## 2018-10-30 ENCOUNTER — Other Ambulatory Visit: Payer: Self-pay

## 2018-10-30 ENCOUNTER — Ambulatory Visit (INDEPENDENT_AMBULATORY_CARE_PROVIDER_SITE_OTHER): Payer: Medicaid Other | Admitting: Advanced Practice Midwife

## 2018-10-30 ENCOUNTER — Encounter: Payer: Self-pay | Admitting: Advanced Practice Midwife

## 2018-10-30 ENCOUNTER — Ambulatory Visit (HOSPITAL_COMMUNITY)
Admission: RE | Admit: 2018-10-30 | Discharge: 2018-10-30 | Disposition: A | Discharge status: Home / Self Care | Payer: Medicaid Other | Source: Ambulatory Visit | Attending: Obstetrics and Gynecology | Admitting: Obstetrics and Gynecology

## 2018-10-30 ENCOUNTER — Ambulatory Visit (HOSPITAL_COMMUNITY): Payer: Medicaid Other | Admitting: *Deleted

## 2018-10-30 VITALS — BP 110/69 | HR 88 | Temp 98.4°F

## 2018-10-30 VITALS — BP 103/64 | HR 70 | Wt 161.0 lb

## 2018-10-30 DIAGNOSIS — O0990 Supervision of high risk pregnancy, unspecified, unspecified trimester: Secondary | ICD-10-CM

## 2018-10-30 DIAGNOSIS — O0993 Supervision of high risk pregnancy, unspecified, third trimester: Secondary | ICD-10-CM

## 2018-10-30 DIAGNOSIS — Z362 Encounter for other antenatal screening follow-up: Secondary | ICD-10-CM

## 2018-10-30 DIAGNOSIS — O30043 Twin pregnancy, dichorionic/diamniotic, third trimester: Secondary | ICD-10-CM

## 2018-10-30 DIAGNOSIS — Z3A30 30 weeks gestation of pregnancy: Secondary | ICD-10-CM

## 2018-10-30 DIAGNOSIS — O30049 Twin pregnancy, dichorionic/diamniotic, unspecified trimester: Secondary | ICD-10-CM

## 2018-10-30 NOTE — Patient Instructions (Signed)
Preterm Labor and Birth Information Pregnancy normally lasts 39-41 weeks. Preterm labor is when labor starts early. It starts before you have been pregnant for 37 whole weeks. What are the risk factors for preterm labor? Preterm labor is more likely to occur in women who:  Have an infection while pregnant.  Have a cervix that is short.  Have gone into preterm labor before.  Have had surgery on their cervix.  Are younger than age 22.  Are older than age 68.  Are African American.  Are pregnant with two or more babies.  Take street drugs while pregnant.  Smoke while pregnant.  Do not gain enough weight while pregnant.  Got pregnant right after another pregnancy. What are the symptoms of preterm labor? Symptoms of preterm labor include:  Cramps. The cramps may feel like the cramps some women get during their period. The cramps may happen with watery poop (diarrhea).  Pain in the belly (abdomen).  Pain in the lower back.  Regular contractions or tightening. It may feel like your belly is getting tighter.  Pressure in the lower belly that seems to get stronger.  More fluid (discharge) leaking from the vagina. The fluid may be watery or bloody.  Water breaking. Why is it important to notice signs of preterm labor? Babies who are born early may not be fully developed. They have a higher chance for:  Long-term heart problems.  Long-term lung problems.  Trouble controlling body systems, like breathing.  Bleeding in the brain.  A condition called cerebral palsy.  Learning difficulties.  Death. These risks are highest for babies who are born before 34 weeks of pregnancy. How is preterm labor treated? Treatment depends on:  How long you were pregnant.  Your condition.  The health of your baby. Treatment may involve:  Having a stitch (suture) placed in your cervix. When you give birth, your cervix opens so the baby can come out. The stitch keeps the cervix  from opening too soon.  Staying at the hospital.  Taking or getting medicines, such as: ? Hormone medicines. ? Medicines to stop contractions. ? Medicines to help the babys lungs develop. ? Medicines to prevent your baby from having cerebral palsy. What should I do if I am in preterm labor? If you think you are going into labor too soon, call your doctor right away. How can I prevent preterm labor?  Do not use any tobacco products. ? Examples of these are cigarettes, chewing tobacco, and e-cigarettes. ? If you need help quitting, ask your doctor.  Do not use street drugs.  Do not use any medicines unless you ask your doctor if they are safe for you.  Talk with your doctor before taking any herbal supplements.  Make sure you gain enough weight.  Watch for infection. If you think you might have an infection, get it checked right away.  If you have gone into preterm labor before, tell your doctor. This information is not intended to replace advice given to you by your health care provider. Make sure you discuss any questions you have with your health care provider. Document Released: 06/03/2008 Document Revised: 06/29/2018 Document Reviewed: 07/29/2015 Elsevier Patient Education  2020 [employer]. Third Trimester of Pregnancy The third trimester is from week 28 through week 40 (months 7 through 9). The third trimester is a time when the unborn baby (fetus) is growing rapidly. At the end of the ninth month, the fetus is about 20 inches in length and weighs 6-10  pounds. Body changes during your third trimester Your body will continue to go through many changes during pregnancy. The changes vary from woman to woman. During the third trimester:  Your weight will continue to increase. You can expect to gain 25-35 pounds (11-16 kg) by the end of the pregnancy.  You may begin to get stretch marks on your hips, abdomen, and breasts.  You may urinate more often because the fetus is  moving lower into your pelvis and pressing on your bladder.  You may develop or continue to have heartburn. This is caused by increased hormones that slow down muscles in the digestive tract.  You may develop or continue to have constipation because increased hormones slow digestion and cause the muscles that push waste through your intestines to relax.  You may develop hemorrhoids. These are swollen veins (varicose veins) in the rectum that can itch or be painful.  You may develop swollen, bulging veins (varicose veins) in your legs.  You may have increased body aches in the pelvis, back, or thighs. This is due to weight gain and increased hormones that are relaxing your joints.  You may have changes in your hair. These can include thickening of your hair, rapid growth, and changes in texture. Some women also have hair loss during or after pregnancy, or hair that feels dry or thin. Your hair will most likely return to normal after your baby is born.  Your breasts will continue to grow and they will continue to become tender. A yellow fluid (colostrum) may leak from your breasts. This is the first milk you are producing for your baby.  Your belly button may stick out.  You may notice more swelling in your hands, face, or ankles.  You may have increased tingling or numbness in your hands, arms, and legs. The skin on your belly may also feel numb.  You may feel short of breath because of your expanding uterus.  You may have more problems sleeping. This can be caused by the size of your belly, increased need to urinate, and an increase in your body's metabolism.  You may notice the fetus "dropping," or moving lower in your abdomen (lightening).  You may have increased vaginal discharge.  You may notice your joints feel loose and you may have pain around your pelvic bone. What to expect at prenatal visits You will have prenatal exams every 2 weeks until week 36. Then you will have weekly  prenatal exams. During a routine prenatal visit:  You will be weighed to make sure you and the baby are growing normally.  Your blood pressure will be taken.  Your abdomen will be measured to track your baby's growth.  The fetal heartbeat will be listened to.  Any test results from the previous visit will be discussed.  You may have a cervical check near your due date to see if your cervix has softened or thinned (effaced).  You will be tested for Group B streptococcus. This happens between 35 and 37 weeks. Your health care provider may ask you:  What your birth plan is.  How you are feeling.  If you are feeling the baby move.  If you have had any abnormal symptoms, such as leaking fluid, bleeding, severe headaches, or abdominal cramping.  If you are using any tobacco products, including cigarettes, chewing tobacco, and electronic cigarettes.  If you have any questions. Other tests or screenings that may be performed during your third trimester include:  Blood tests that  check for low iron levels (anemia).  Fetal testing to check the health, activity level, and growth of the fetus. Testing is done if you have certain medical conditions or if there are problems during the pregnancy.  Nonstress test (NST). This test checks the health of your baby to make sure there are no signs of problems, such as the baby not getting enough oxygen. During this test, a belt is placed around your belly. The baby is made to move, and its heart rate is monitored during movement. What is false labor? False labor is a condition in which you feel small, irregular tightenings of the muscles in the womb (contractions) that usually go away with rest, changing position, or drinking water. These are called Braxton Hicks contractions. Contractions may last for hours, days, or even weeks before true labor sets in. If contractions come at regular intervals, become more frequent, increase in intensity, or become  painful, you should see your health care provider. What are the signs of labor?  Abdominal cramps.  Regular contractions that start at 10 minutes apart and become stronger and more frequent with time.  Contractions that start on the top of the uterus and spread down to the lower abdomen and back.  Increased pelvic pressure and dull back pain.  A watery or bloody mucus discharge that comes from the vagina.  Leaking of amniotic fluid. This is also known as your "water breaking." It could be a slow trickle or a gush. Let your health care provider know if it has a color or strange odor. If you have any of these signs, call your health care provider right away, even if it is before your due date. Follow these instructions at home: Medicines  Follow your health care provider's instructions regarding medicine use. Specific medicines may be either safe or unsafe to take during pregnancy.  Take a prenatal vitamin that contains at least 600 micrograms (mcg) of folic acid.  If you develop constipation, try taking a stool softener if your health care provider approves. Eating and drinking   Eat a balanced diet that includes fresh fruits and vegetables, whole grains, good sources of protein such as meat, eggs, or tofu, and low-fat dairy. Your health care provider will help you determine the amount of weight gain that is right for you.  Avoid raw meat and uncooked cheese. These carry germs that can cause birth defects in the baby.  If you have low calcium intake from food, talk to your health care provider about whether you should take a daily calcium supplement.  Eat four or five small meals rather than three large meals a day.  Limit foods that are high in fat and processed sugars, such as fried and sweet foods.  To prevent constipation: ? Drink enough fluid to keep your urine clear or pale yellow. ? Eat foods that are high in fiber, such as fresh fruits and vegetables, whole grains, and  beans. Activity  Exercise only as directed by your health care provider. Most women can continue their usual exercise routine during pregnancy. Try to exercise for 30 minutes at least 5 days a week. Stop exercising if you experience uterine contractions.  Avoid heavy lifting.  Do not exercise in extreme heat or humidity, or at high altitudes.  Wear low-heel, comfortable shoes.  Practice good posture.  You may continue to have sex unless your health care provider tells you otherwise. Relieving pain and discomfort  Take frequent breaks and rest with your legs elevated  if you have leg cramps or low back pain.  Take warm sitz baths to soothe any pain or discomfort caused by hemorrhoids. Use hemorrhoid cream if your health care provider approves.  Wear a good support bra to prevent discomfort from breast tenderness.  If you develop varicose veins: ? Wear support pantyhose or compression stockings as told by your healthcare provider. ? Elevate your feet for 15 minutes, 3-4 times a day. Prenatal care  Write down your questions. Take them to your prenatal visits.  Keep all your prenatal visits as told by your health care provider. This is important. Safety  Wear your seat belt at all times when driving.  Make a list of emergency phone numbers, including numbers for family, friends, the hospital, and police and fire departments. General instructions  Avoid cat litter boxes and soil used by cats. These carry germs that can cause birth defects in the baby. If you have a cat, ask someone to clean the litter box for you.  Do not travel far distances unless it is absolutely necessary and only with the approval of your health care provider.  Do not use hot tubs, steam rooms, or saunas.  Do not drink alcohol.  Do not use any products that contain nicotine or tobacco, such as cigarettes and e-cigarettes. If you need help quitting, ask your health care provider.  Do not use any medicinal  herbs or unprescribed drugs. These chemicals affect the formation and growth of the baby.  Do not douche or use tampons or scented sanitary pads.  Do not cross your legs for long periods of time.  To prepare for the arrival of your baby: ? Take prenatal classes to understand, practice, and ask questions about labor and delivery. ? Make a trial run to the hospital. ? Visit the hospital and tour the maternity area. ? Arrange for maternity or paternity leave through employers. ? Arrange for family and friends to take care of pets while you are in the hospital. ? Purchase a rear-facing car seat and make sure you know how to install it in your car. ? Pack your hospital bag. ? Prepare the babys nursery. Make sure to remove all pillows and stuffed animals from the baby's crib to prevent suffocation.  Visit your dentist if you have not gone during your pregnancy. Use a soft toothbrush to brush your teeth and be gentle when you floss. Contact a health care provider if:  You are unsure if you are in labor or if your water has broken.  You become dizzy.  You have mild pelvic cramps, pelvic pressure, or nagging pain in your abdominal area.  You have lower back pain.  You have persistent nausea, vomiting, or diarrhea.  You have an unusual or bad smelling vaginal discharge.  You have pain when you urinate. Get help right away if:  Your water breaks before 37 weeks.  You have regular contractions less than 5 minutes apart before 37 weeks.  You have a fever.  You are leaking fluid from your vagina.  You have spotting or bleeding from your vagina.  You have severe abdominal pain or cramping.  You have rapid weight loss or weight gain.  You have shortness of breath with chest pain.  You notice sudden or extreme swelling of your face, hands, ankles, feet, or legs.  Your baby makes fewer than 10 movements in 2 hours.  You have severe headaches that do not go away when you take  medicine.  You have  vision changes. Summary  The third trimester is from week 28 through week 40, months 7 through 9. The third trimester is a time when the unborn baby (fetus) is growing rapidly.  During the third trimester, your discomfort may increase as you and your baby continue to gain weight. You may have abdominal, leg, and back pain, sleeping problems, and an increased need to urinate.  During the third trimester your breasts will keep growing and they will continue to become tender. A yellow fluid (colostrum) may leak from your breasts. This is the first milk you are producing for your baby.  False labor is a condition in which you feel small, irregular tightenings of the muscles in the womb (contractions) that eventually go away. These are called Braxton Hicks contractions. Contractions may last for hours, days, or even weeks before true labor sets in.  Signs of labor can include: abdominal cramps; regular contractions that start at 10 minutes apart and become stronger and more frequent with time; watery or bloody mucus discharge that comes from the vagina; increased pelvic pressure and dull back pain; and leaking of amniotic fluid. This information is not intended to replace advice given to you by your health care provider. Make sure you discuss any questions you have with your health care provider. Document Released: 03/01/2001 Document Revised: 06/28/2018 Document Reviewed: 04/12/2016 Elsevier Patient Education  2020 [employer].

## 2018-10-30 NOTE — Progress Notes (Signed)
error 

## 2018-10-30 NOTE — Progress Notes (Signed)
   PRENATAL VISIT NOTE  Subjective:  Danielle Crawford is a 23 y.o. G2P1001 at [redacted]w[redacted]d being seen today for ongoing prenatal care.  She is currently monitored for the following issues for this high-risk pregnancy and has Dichorionic diamniotic twin gestation; Pregnancy; and Migraine without aura and with status migrainosus, not intractable on their problem list.  Patient reports occasional contractions.  Contractions: Irregular. Vag. Bleeding: None.  Movement: Present. Denies leaking of fluid.  Had [place] today at [facility].   Results pending.    Has had some irregular contractions, mostly nonpainful.    The following portions of the patient's history were reviewed and updated as appropriate: allergies, current medications, past family history, past medical history, past social history, past surgical history and problem list.   Objective:   Vitals:   10/30/18 1026  BP: 103/64  Pulse: 70  Weight: 73 kg    Fetal Status: Fetal Heart Rate (bpm): 132/ 138   Movement: Present     General:  Alert, oriented and cooperative. Patient is in no acute distress.  Skin: Skin is warm and dry. No rash noted.   Cardiovascular: Normal heart rate noted  Respiratory: Normal respiratory effort, no problems with respiration noted  Abdomen: Soft, gravid, appropriate for gestational age.  Pain/Pressure: Absent     Pelvic: Cervical exam deferred        Extremities: Normal range of motion.  Edema: None  Mental Status: Normal mood and affect. Normal behavior. Normal judgment and thought content.   Assessment and Plan:  Pregnancy: G2P1001 at [redacted]w[redacted]d Patient Active Problem List   Diagnosis Date Noted  . Dichorionic diamniotic twin gestation 05/14/2018  . Pregnancy 05/14/2018  . Migraine without aura and with status migrainosus, not intractable 08/19/2016   Reviewed difference between Braxton-Hicks contractions and preterm labor contractions Advised to go to [facility] for more than 5/hour of painful contractions  Preterm  labor symptoms and general obstetric precautions including but not limited to vaginal bleeding, contractions, leaking of fluid and fetal movement were reviewed in detail with the patient. Please refer to After Visit Summary for other counseling recommendations.   Return in about 2 weeks (around 11/13/2018) for [facility]  Future Appointments  Date Time Provider [place]  11/22/2018  2:00 PM Truett Mainland, DO [facility] None  11/27/2018  1:00 PM [place] NURSE [place] [facility]  11/27/2018  1:00 PM [place] 3 [facility]    Hansel Feinstein, CNM

## 2018-11-18 ENCOUNTER — Encounter: Payer: Self-pay | Admitting: Advanced Practice Midwife

## 2018-11-22 ENCOUNTER — Other Ambulatory Visit: Payer: Self-pay

## 2018-11-22 ENCOUNTER — Ambulatory Visit (INDEPENDENT_AMBULATORY_CARE_PROVIDER_SITE_OTHER): Payer: Medicaid Other | Admitting: Family Medicine

## 2018-11-22 VITALS — BP 109/62 | HR 62 | Wt 167.0 lb

## 2018-11-22 DIAGNOSIS — O30043 Twin pregnancy, dichorionic/diamniotic, third trimester: Secondary | ICD-10-CM

## 2018-11-22 DIAGNOSIS — Z3A33 33 weeks gestation of pregnancy: Secondary | ICD-10-CM

## 2018-11-22 DIAGNOSIS — O30049 Twin pregnancy, dichorionic/diamniotic, unspecified trimester: Secondary | ICD-10-CM

## 2018-11-22 DIAGNOSIS — O0990 Supervision of high risk pregnancy, unspecified, unspecified trimester: Secondary | ICD-10-CM

## 2018-11-22 DIAGNOSIS — O0993 Supervision of high risk pregnancy, unspecified, third trimester: Secondary | ICD-10-CM | POA: Diagnosis not present

## 2018-11-22 NOTE — Progress Notes (Signed)
   PRENATAL VISIT NOTE  Subjective:  Danielle Crawford is a 23 y.o. G2P1001 at [redacted]w[redacted]d being seen today for ongoing prenatal care.  She is currently monitored for the following issues for this high-risk pregnancy and has Dichorionic diamniotic twin gestation; Pregnancy; and Migraine without aura and with status migrainosus, not intractable on their problem list.  Patient reports occasional contractions.  Contractions: Irritability. Vag. Bleeding: None.  Movement: (!) Decreased. Denies leaking of fluid.   The following portions of the patient's history were reviewed and updated as appropriate: allergies, current medications, past family history, past medical history, past social history, past surgical history and problem list.   Objective:   Vitals:   11/22/18 1401  BP: 109/62  Pulse: 62  Weight: 167 lb (75.8 kg)    Fetal Status: Fetal Heart Rate (bpm): 119/125 Fundal Height: 40 cm Movement: (!) Decreased     General:  Alert, oriented and cooperative. Patient is in no acute distress.  Skin: Skin is warm and dry. No rash noted.   Cardiovascular: Normal heart rate noted  Respiratory: Normal respiratory effort, no problems with respiration noted  Abdomen: Soft, gravid, appropriate for gestational age.  Pain/Pressure: Present     Pelvic: Cervical exam deferred        Extremities: Normal range of motion.  Edema: Trace  Mental Status: Normal mood and affect. Normal behavior. Normal judgment and thought content.   Assessment and Plan:  Pregnancy: G2P1001 at [redacted]w[redacted]d 1. Supervision of high risk pregnancy, antepartum FHT normal  2. Dichorionic diamniotic twin pregnancy, antepartum Baby A 58%, baby B 99% (Vtx, breech) 21% discordance with no evidence of growth restriction Next [place] 9/8 Delivery at 38 weeks  Preterm labor symptoms and general obstetric precautions including but not limited to vaginal bleeding, contractions, leaking of fluid and fetal movement were reviewed in detail with the  patient. Please refer to After Visit Summary for other counseling recommendations.   No follow-ups on file.  Future Appointments  Date Time Provider [place]  11/27/2018  1:00 PM [place] [facility]  11/27/2018  1:00 PM [facility] [place] 3 [facility]     J , DO

## 2018-11-23 ENCOUNTER — Telehealth: Payer: Self-pay

## 2018-11-23 NOTE — Telephone Encounter (Signed)
Pt sent MyChart message stating stating she mentioned decreased fetal movements in the office on 11/22/18. Pt states she felt no one was concerned. Called pt and explained to her that it was most likely missed and we do apologize. Pt states she does feel movements but not as much as before. Pt advised to go to [facility] to be seen. Understanding was voiced.  chiquita l wilson, CMA

## 2018-11-27 ENCOUNTER — Ambulatory Visit (HOSPITAL_COMMUNITY): Payer: Medicaid Other

## 2018-12-03 ENCOUNTER — Encounter: Payer: Self-pay | Admitting: Advanced Practice Midwife

## 2018-12-06 ENCOUNTER — Other Ambulatory Visit: Payer: Self-pay

## 2018-12-06 ENCOUNTER — Encounter: Payer: Medicaid Other | Admitting: Family Medicine

## 2018-12-06 ENCOUNTER — Other Ambulatory Visit (HOSPITAL_COMMUNITY): Payer: Self-pay | Admitting: Obstetrics and Gynecology

## 2018-12-06 ENCOUNTER — Other Ambulatory Visit: Payer: Self-pay | Admitting: Obstetrics and Gynecology

## 2018-12-06 ENCOUNTER — Inpatient Hospital Stay (HOSPITAL_COMMUNITY)
Admission: AD | Admit: 2018-12-06 | Discharge: 2018-12-10 | DRG: 788 | Disposition: A | Discharge status: Home / Self Care | Payer: Medicaid Other | Attending: Obstetrics and Gynecology | Admitting: Obstetrics and Gynecology

## 2018-12-06 ENCOUNTER — Encounter (HOSPITAL_COMMUNITY): Payer: Self-pay

## 2018-12-06 ENCOUNTER — Ambulatory Visit (HOSPITAL_COMMUNITY)
Admission: RE | Admit: 2018-12-06 | Discharge: 2018-12-06 | Disposition: A | Discharge status: Home / Self Care | Payer: Medicaid Other | Source: Ambulatory Visit | Attending: Obstetrics and Gynecology | Admitting: Obstetrics and Gynecology

## 2018-12-06 ENCOUNTER — Ambulatory Visit (HOSPITAL_COMMUNITY): Payer: Medicaid Other | Admitting: *Deleted

## 2018-12-06 VITALS — BP 112/65 | HR 92 | Temp 98.5°F

## 2018-12-06 DIAGNOSIS — Z3A35 35 weeks gestation of pregnancy: Secondary | ICD-10-CM | POA: Diagnosis not present

## 2018-12-06 DIAGNOSIS — O321XX2 Maternal care for breech presentation, fetus 2: Secondary | ICD-10-CM | POA: Diagnosis present

## 2018-12-06 DIAGNOSIS — O30049 Twin pregnancy, dichorionic/diamniotic, unspecified trimester: Secondary | ICD-10-CM

## 2018-12-06 DIAGNOSIS — O9902 Anemia complicating childbirth: Secondary | ICD-10-CM | POA: Diagnosis present

## 2018-12-06 DIAGNOSIS — O30043 Twin pregnancy, dichorionic/diamniotic, third trimester: Secondary | ICD-10-CM

## 2018-12-06 DIAGNOSIS — Z3A36 36 weeks gestation of pregnancy: Secondary | ICD-10-CM | POA: Diagnosis not present

## 2018-12-06 DIAGNOSIS — O30003 Twin pregnancy, unspecified number of placenta and unspecified number of amniotic sacs, third trimester: Secondary | ICD-10-CM | POA: Diagnosis present

## 2018-12-06 DIAGNOSIS — Z362 Encounter for other antenatal screening follow-up: Secondary | ICD-10-CM

## 2018-12-06 DIAGNOSIS — Z20828 Contact with and (suspected) exposure to other viral communicable diseases: Secondary | ICD-10-CM | POA: Diagnosis not present

## 2018-12-06 DIAGNOSIS — O365930 Maternal care for other known or suspected poor fetal growth, third trimester, not applicable or unspecified: Secondary | ICD-10-CM | POA: Insufficient documentation

## 2018-12-06 DIAGNOSIS — O365931 Maternal care for other known or suspected poor fetal growth, third trimester, fetus 1: Secondary | ICD-10-CM

## 2018-12-06 DIAGNOSIS — Z98891 History of uterine scar from previous surgery: Secondary | ICD-10-CM

## 2018-12-06 DIAGNOSIS — D649 Anemia, unspecified: Secondary | ICD-10-CM | POA: Diagnosis present

## 2018-12-06 DIAGNOSIS — O43813 Placental infarction, third trimester: Secondary | ICD-10-CM | POA: Diagnosis not present

## 2018-12-06 LAB — CBC
HCT: 27.7 % — ABNORMAL LOW (ref 36.0–46.0)
Hemoglobin: 8.8 g/dL — ABNORMAL LOW (ref 12.0–15.0)
MCH: 24.6 pg — ABNORMAL LOW (ref 26.0–34.0)
MCHC: 31.8 g/dL (ref 30.0–36.0)
MCV: 77.4 fL — ABNORMAL LOW (ref 80.0–100.0)
Platelets: 158 10*3/uL (ref 150–400)
RBC: 3.58 MIL/uL — ABNORMAL LOW (ref 3.87–5.11)
RDW: 15 % (ref 11.5–15.5)
WBC: 7.4 10*3/uL (ref 4.0–10.5)
nRBC: 0.3 % — ABNORMAL HIGH (ref 0.0–0.2)

## 2018-12-06 LAB — TYPE AND SCREEN
ABO/RH(D): O POS
Antibody Screen: NEGATIVE

## 2018-12-06 LAB — SARS CORONAVIRUS 2 BY RT PCR (HOSPITAL ORDER, PERFORMED IN ~~LOC~~ HOSPITAL LAB): SARS Coronavirus 2: NEGATIVE

## 2018-12-06 LAB — ABO/RH: ABO/RH(D): O POS

## 2018-12-06 MED ORDER — ACETAMINOPHEN 325 MG PO TABS
650.0000 mg | ORAL_TABLET | ORAL | Status: DC | PRN
  Administered 2018-12-06: 650 mg via ORAL
  Filled 2018-12-06: qty 2

## 2018-12-06 MED ORDER — PRENATAL MULTIVITAMIN CH: 1.0000 | ORAL_TABLET | Freq: Every day | ORAL | Status: DC

## 2018-12-06 MED ORDER — BETAMETHASONE SOD PHOS & ACET 6 (3-3) MG/ML IJ SUSP
12.0000 mg | INTRAMUSCULAR | Status: AC
  Administered 2018-12-06 – 2018-12-07 (×2): 12 mg via INTRAMUSCULAR
  Filled 2018-12-06 (×2): qty 2

## 2018-12-06 MED ORDER — SODIUM CHLORIDE 0.9% FLUSH: 3.0000 mL | INTRAVENOUS | Status: DC | PRN

## 2018-12-06 MED ORDER — BETAMETHASONE SOD PHOS & ACET 6 (3-3) MG/ML IJ SUSP: 12.0000 mg | INTRAMUSCULAR | Status: DC

## 2018-12-06 MED ORDER — CALCIUM CARBONATE ANTACID 500 MG PO CHEW: 2.0000 | CHEWABLE_TABLET | ORAL | Status: DC | PRN

## 2018-12-06 MED ORDER — SODIUM CHLORIDE 0.9% FLUSH
3.0000 mL | Freq: Two times a day (BID) | INTRAVENOUS | Status: DC
  Administered 2018-12-06 – 2018-12-09 (×2): 3 mL via INTRAVENOUS

## 2018-12-06 MED ORDER — DOCUSATE SODIUM 50 MG PO CAPS: 100.0000 mg | ORAL_CAPSULE | Freq: Every day | ORAL | Status: DC

## 2018-12-06 MED ORDER — CEFAZOLIN SODIUM-DEXTROSE 2-4 GM/100ML-% IV SOLN
2.0000 g | INTRAVENOUS | Status: AC
  Administered 2018-12-07: 2 g via INTRAVENOUS
  Filled 2018-12-06: qty 100

## 2018-12-06 MED ORDER — DOCUSATE SODIUM 100 MG PO CAPS: 100.0000 mg | ORAL_CAPSULE | Freq: Every day | ORAL | Status: DC

## 2018-12-06 MED ORDER — ACETAMINOPHEN 325 MG PO TABS: 650.0000 mg | ORAL_TABLET | ORAL | Status: DC | PRN

## 2018-12-06 MED ORDER — SODIUM CHLORIDE 0.9 % IV SOLN: 250.0000 mL | INTRAVENOUS | Status: DC | PRN

## 2018-12-06 NOTE — H&P (Signed)
Danielle Crawford is a 23 y.o. female G2P1001 at [redacted]w[redacted]d with di-di twins with growth discordance presenting for antepartum admission in preparation for delivery. Patient with prenatal care at [facility] complicated by di-di twins. Patient seen by MFM today for routine antenatal testing and growth ultrasound. Dr. Annamaria Boots contacted attending on call to inform of growth discordance and delivery plan. Patient reports feeling well and is without complaints. She denies contractions, vaginal bleeding or leakage of fluid. She reports good fetal movement  OB History    Gravida  2   Para  1   Term  1   Preterm      AB      Living  1     SAB      TAB      Ectopic      Multiple  1   Live Births  1          Past Medical History:  Diagnosis Date  . Headache    Past Surgical History:  Procedure Laterality Date  . WISDOM TOOTH EXTRACTION     Family History: family history includes Cancer in an other family member. Social History:  reports that she has never smoked. She has never used smokeless tobacco. She reports that she does not drink alcohol or use drugs.     Maternal Diabetes: No Genetic Screening: Normal Maternal Ultrasounds/Referrals: Normal Fetal Ultrasounds or other Referrals:  None Maternal Substance Abuse:  No Significant Maternal Medications:  None Significant Maternal Lab Results:  None Other Comments:  None  ROS  See pertinent in HPI History   Blood pressure 128/74, pulse 80, temperature 98 F (36.7 C), temperature source Oral, resp. rate 16, last menstrual period 03/30/2018, SpO2 99 %. Exam Physical Exam  GENERAL: Well-developed, well-nourished female in no acute distress.  HEENT: Normocephalic, atraumatic. Sclerae anicteric.  NECK: Supple. Normal thyroid.  LUNGS: Clear to auscultation bilaterally.  HEART: Regular rate and rhythm. BREASTS: Symmetric in size. No palpable masses or lymphadenopathy, skin changes, or nipple drainage. ABDOMEN: Soft, nontender,  nondistended. No organomegaly. PELVIC: Normal external female genitalia. Vagina is pink and rugated.  Normal discharge. Normal appearing cervix. Uterus is normal in size.  No adnexal mass or tenderness. EXTREMITIES: No cyanosis, clubbing, or edema, 2+ distal pulses.  Prenatal labs: ABO, Rh: O/Positive/-- (03/10 0000) Antibody: Negative (03/10 0000) Rubella: Immune (03/10 0000) RPR: Non Reactive (07/24 0854)  HBsAg: Negative (03/10 0000)  HIV: Non Reactive (07/24 0854)  GBS:     Assessment/Plan: 23 yo G2P1001 at [redacted]w[redacted]d with di-di twin pregnancy with growth discordance and per MFM report twin B is in breech presentation - Discussed plan for BMZ today and tomorrow prior to  Delivery - Given twin B being the larger fetus and in breech presentation, discussed delivery via cesarean section. Risks, benefits and alternatives were explained including but not limited to risks of bleeding, infection and damage to adjacent organs. Patient verbalized understanding and all questions were answered. Consent signed   Danielle Crawford 12/06/2018, 4:05 PM

## 2018-12-07 ENCOUNTER — Inpatient Hospital Stay (HOSPITAL_COMMUNITY): Payer: Medicaid Other | Admitting: Anesthesiology

## 2018-12-07 ENCOUNTER — Encounter (HOSPITAL_COMMUNITY): Payer: Self-pay | Admitting: *Deleted

## 2018-12-07 ENCOUNTER — Encounter (HOSPITAL_COMMUNITY)
Admission: AD | Disposition: A | Discharge status: Home / Self Care | Payer: Self-pay | Source: Home / Self Care | Attending: Obstetrics and Gynecology

## 2018-12-07 ENCOUNTER — Inpatient Hospital Stay (HOSPITAL_COMMUNITY)
Admission: RE | Admit: 2018-12-07 | Payer: Medicaid Other | Source: Home / Self Care | Admitting: Obstetrics and Gynecology

## 2018-12-07 DIAGNOSIS — Z3A36 36 weeks gestation of pregnancy: Secondary | ICD-10-CM

## 2018-12-07 DIAGNOSIS — O30003 Twin pregnancy, unspecified number of placenta and unspecified number of amniotic sacs, third trimester: Secondary | ICD-10-CM

## 2018-12-07 DIAGNOSIS — O30043 Twin pregnancy, dichorionic/diamniotic, third trimester: Secondary | ICD-10-CM

## 2018-12-07 DIAGNOSIS — Z3A35 35 weeks gestation of pregnancy: Secondary | ICD-10-CM

## 2018-12-07 DIAGNOSIS — O321XX2 Maternal care for breech presentation, fetus 2: Secondary | ICD-10-CM

## 2018-12-07 DIAGNOSIS — O365931 Maternal care for other known or suspected poor fetal growth, third trimester, fetus 1: Secondary | ICD-10-CM

## 2018-12-07 LAB — RPR: RPR Ser Ql: NONREACTIVE

## 2018-12-07 SURGERY — Surgical Case
Anesthesia: Spinal

## 2018-12-07 MED ORDER — DIBUCAINE (PERIANAL) 1 % EX OINT: 1.0000 "application " | TOPICAL_OINTMENT | CUTANEOUS | Status: DC | PRN

## 2018-12-07 MED ORDER — SOD CITRATE-CITRIC ACID 500-334 MG/5ML PO SOLN
ORAL | Status: AC
  Filled 2018-12-07: qty 30

## 2018-12-07 MED ORDER — DIPHENHYDRAMINE HCL 50 MG/ML IJ SOLN: 12.5000 mg | Freq: Four times a day (QID) | INTRAMUSCULAR | Status: DC | PRN

## 2018-12-07 MED ORDER — SODIUM CHLORIDE 0.9 % IV SOLN
500.0000 mg | INTRAVENOUS | Status: DC
  Administered 2018-12-07: 500 mg via INTRAVENOUS
  Filled 2018-12-07 (×2): qty 500

## 2018-12-07 MED ORDER — ACETAMINOPHEN 500 MG PO TABS
ORAL_TABLET | ORAL | Status: AC
  Filled 2018-12-07: qty 2

## 2018-12-07 MED ORDER — NALBUPHINE HCL 10 MG/ML IJ SOLN
5.0000 mg | Freq: Once | INTRAMUSCULAR | Status: DC | PRN
  Filled 2018-12-07: qty 0.5

## 2018-12-07 MED ORDER — MORPHINE SULFATE (PF) 0.5 MG/ML IJ SOLN
INTRAMUSCULAR | Status: DC | PRN
  Administered 2018-12-07: .15 mg via INTRATHECAL

## 2018-12-07 MED ORDER — PROMETHAZINE HCL 25 MG/ML IJ SOLN: 6.2500 mg | INTRAMUSCULAR | Status: DC | PRN

## 2018-12-07 MED ORDER — ENOXAPARIN SODIUM 40 MG/0.4ML ~~LOC~~ SOLN
40.0000 mg | SUBCUTANEOUS | Status: DC
  Filled 2018-12-07: qty 0.4

## 2018-12-07 MED ORDER — NALBUPHINE HCL 10 MG/ML IJ SOLN
5.0000 mg | INTRAMUSCULAR | Status: DC | PRN
  Filled 2018-12-07: qty 0.5

## 2018-12-07 MED ORDER — FAMOTIDINE 20 MG PO TABS
20.0000 mg | ORAL_TABLET | Freq: Once | ORAL | Status: AC
  Administered 2018-12-07: 20 mg via ORAL

## 2018-12-07 MED ORDER — SENNOSIDES-DOCUSATE SODIUM 8.6-50 MG PO TABS
2.0000 | ORAL_TABLET | ORAL | Status: DC
  Administered 2018-12-08 – 2018-12-10 (×3): 2 via ORAL
  Filled 2018-12-07 (×3): qty 2

## 2018-12-07 MED ORDER — DIPHENHYDRAMINE HCL 25 MG PO CAPS: 25.0000 mg | ORAL_CAPSULE | ORAL | Status: DC | PRN

## 2018-12-07 MED ORDER — SODIUM CHLORIDE 0.9 % IV SOLN
INTRAVENOUS | Status: AC
  Filled 2018-12-07: qty 500

## 2018-12-07 MED ORDER — SIMETHICONE 80 MG PO CHEW: 80.0000 mg | CHEWABLE_TABLET | ORAL | Status: DC | PRN

## 2018-12-07 MED ORDER — ACETAMINOPHEN 500 MG PO TABS
1000.0000 mg | ORAL_TABLET | Freq: Once | ORAL | Status: AC
  Administered 2018-12-07: 1000 mg via ORAL

## 2018-12-07 MED ORDER — OXYCODONE HCL 5 MG PO TABS
5.0000 mg | ORAL_TABLET | ORAL | Status: DC | PRN
  Administered 2018-12-09 – 2018-12-10 (×7): 5 mg via ORAL
  Filled 2018-12-07 (×7): qty 1

## 2018-12-07 MED ORDER — BUPIVACAINE IN DEXTROSE 0.75-8.25 % IT SOLN
INTRATHECAL | Status: DC | PRN
  Administered 2018-12-07: 1.7 mL via INTRATHECAL

## 2018-12-07 MED ORDER — SCOPOLAMINE 1 MG/3DAYS TD PT72: 1.0000 | MEDICATED_PATCH | Freq: Once | TRANSDERMAL | Status: DC

## 2018-12-07 MED ORDER — ONDANSETRON HCL 4 MG/2ML IJ SOLN
INTRAMUSCULAR | Status: AC
  Filled 2018-12-07: qty 2

## 2018-12-07 MED ORDER — FAMOTIDINE 20 MG PO TABS
ORAL_TABLET | ORAL | Status: AC
  Filled 2018-12-07: qty 1

## 2018-12-07 MED ORDER — SIMETHICONE 80 MG PO CHEW
80.0000 mg | CHEWABLE_TABLET | Freq: Three times a day (TID) | ORAL | Status: DC
  Administered 2018-12-08 – 2018-12-09 (×6): 80 mg via ORAL
  Filled 2018-12-07 (×6): qty 1

## 2018-12-07 MED ORDER — SCOPOLAMINE 1 MG/3DAYS TD PT72
MEDICATED_PATCH | TRANSDERMAL | Status: DC | PRN
  Administered 2018-12-07: 1 via TRANSDERMAL

## 2018-12-07 MED ORDER — LACTATED RINGERS IV SOLN
INTRAVENOUS | Status: DC | PRN
  Administered 2018-12-07 (×2): via INTRAVENOUS

## 2018-12-07 MED ORDER — IBUPROFEN 800 MG PO TABS
800.0000 mg | ORAL_TABLET | Freq: Three times a day (TID) | ORAL | Status: DC
  Administered 2018-12-08 – 2018-12-10 (×8): 800 mg via ORAL
  Filled 2018-12-07 (×8): qty 1

## 2018-12-07 MED ORDER — SODIUM CHLORIDE 0.9 % IV SOLN
INTRAVENOUS | Status: DC | PRN
  Administered 2018-12-07: 16:00:00 via INTRAVENOUS

## 2018-12-07 MED ORDER — WITCH HAZEL-GLYCERIN EX PADS: 1.0000 "application " | MEDICATED_PAD | CUTANEOUS | Status: DC | PRN

## 2018-12-07 MED ORDER — OXYTOCIN 40 UNITS IN NORMAL SALINE INFUSION - SIMPLE MED: 2.5000 [IU]/h | INTRAVENOUS | Status: AC

## 2018-12-07 MED ORDER — ONDANSETRON HCL 4 MG/2ML IJ SOLN
INTRAMUSCULAR | Status: DC | PRN
  Administered 2018-12-07: 4 mg via INTRAVENOUS

## 2018-12-07 MED ORDER — FENTANYL CITRATE (PF) 100 MCG/2ML IJ SOLN
INTRAMUSCULAR | Status: AC
  Filled 2018-12-07: qty 2

## 2018-12-07 MED ORDER — NALOXONE HCL 0.4 MG/ML IJ SOLN: 0.4000 mg | INTRAMUSCULAR | Status: DC | PRN

## 2018-12-07 MED ORDER — SODIUM CHLORIDE 0.9% FLUSH: 3.0000 mL | INTRAVENOUS | Status: DC | PRN

## 2018-12-07 MED ORDER — LACTATED RINGERS IV SOLN
INTRAVENOUS | Status: DC
  Administered 2018-12-08 (×2): via INTRAVENOUS

## 2018-12-07 MED ORDER — COCONUT OIL OIL: 1.0000 "application " | TOPICAL_OIL | Status: DC | PRN

## 2018-12-07 MED ORDER — MEPERIDINE HCL 25 MG/ML IJ SOLN: 6.2500 mg | INTRAMUSCULAR | Status: DC | PRN

## 2018-12-07 MED ORDER — MENTHOL 3 MG MT LOZG: 1.0000 | LOZENGE | OROMUCOSAL | Status: DC | PRN

## 2018-12-07 MED ORDER — PHENYLEPHRINE HCL-NACL 20-0.9 MG/250ML-% IV SOLN
INTRAVENOUS | Status: AC
  Filled 2018-12-07: qty 250

## 2018-12-07 MED ORDER — PRENATAL MULTIVITAMIN CH
1.0000 | ORAL_TABLET | Freq: Every day | ORAL | Status: DC
  Administered 2018-12-08 – 2018-12-09 (×2): 1 via ORAL
  Filled 2018-12-07 (×2): qty 1

## 2018-12-07 MED ORDER — TETANUS-DIPHTH-ACELL PERTUSSIS 5-2.5-18.5 LF-MCG/0.5 IM SUSP: 0.5000 mL | Freq: Once | INTRAMUSCULAR | Status: DC

## 2018-12-07 MED ORDER — SOD CITRATE-CITRIC ACID 500-334 MG/5ML PO SOLN
ORAL | Status: AC
  Filled 2018-12-07: qty 15

## 2018-12-07 MED ORDER — DIPHENHYDRAMINE HCL 50 MG/ML IJ SOLN
INTRAMUSCULAR | Status: DC | PRN
  Administered 2018-12-07: 25 mg via INTRAVENOUS

## 2018-12-07 MED ORDER — SIMETHICONE 80 MG PO CHEW
80.0000 mg | CHEWABLE_TABLET | ORAL | Status: DC
  Administered 2018-12-08 – 2018-12-10 (×3): 80 mg via ORAL
  Filled 2018-12-07 (×3): qty 1

## 2018-12-07 MED ORDER — DIPHENHYDRAMINE HCL 25 MG PO CAPS: 25.0000 mg | ORAL_CAPSULE | Freq: Four times a day (QID) | ORAL | Status: DC | PRN

## 2018-12-07 MED ORDER — GABAPENTIN 100 MG PO CAPS
100.0000 mg | ORAL_CAPSULE | Freq: Three times a day (TID) | ORAL | Status: DC
  Administered 2018-12-07: 100 mg via ORAL
  Filled 2018-12-07 (×2): qty 1

## 2018-12-07 MED ORDER — MORPHINE SULFATE (PF) 0.5 MG/ML IJ SOLN
INTRAMUSCULAR | Status: AC
  Filled 2018-12-07: qty 10

## 2018-12-07 MED ORDER — OXYCODONE HCL 5 MG/5ML PO SOLN: 5.0000 mg | Freq: Once | ORAL | Status: DC | PRN

## 2018-12-07 MED ORDER — HYDROMORPHONE HCL 1 MG/ML IJ SOLN: 0.2500 mg | INTRAMUSCULAR | Status: DC | PRN

## 2018-12-07 MED ORDER — PHENYLEPHRINE HCL-NACL 20-0.9 MG/250ML-% IV SOLN
INTRAVENOUS | Status: DC | PRN
  Administered 2018-12-07: 60 ug/min via INTRAVENOUS

## 2018-12-07 MED ORDER — FENTANYL CITRATE (PF) 100 MCG/2ML IJ SOLN
INTRAMUSCULAR | Status: DC | PRN
  Administered 2018-12-07: 35 ug via INTRAVENOUS
  Administered 2018-12-07: 15 ug via INTRATHECAL

## 2018-12-07 MED ORDER — NALOXONE HCL 4 MG/10ML IJ SOLN
1.0000 ug/kg/h | INTRAVENOUS | Status: DC | PRN
  Filled 2018-12-07: qty 5

## 2018-12-07 MED ORDER — OXYCODONE HCL 5 MG PO TABS: 5.0000 mg | ORAL_TABLET | Freq: Once | ORAL | Status: DC | PRN

## 2018-12-07 MED ORDER — SODIUM CHLORIDE 0.9 % IV SOLN
INTRAVENOUS | Status: DC | PRN
  Administered 2018-12-07: 40 [IU] via INTRAVENOUS

## 2018-12-07 MED ORDER — ONDANSETRON HCL 4 MG/2ML IJ SOLN: 4.0000 mg | Freq: Three times a day (TID) | INTRAMUSCULAR | Status: DC | PRN

## 2018-12-07 MED ORDER — KETOROLAC TROMETHAMINE 30 MG/ML IJ SOLN
30.0000 mg | Freq: Once | INTRAMUSCULAR | Status: AC | PRN
  Administered 2018-12-07: 30 mg via INTRAVENOUS

## 2018-12-07 SURGICAL SUPPLY — 33 items
BENZOIN TINCTURE PRP APPL 2/3 (GAUZE/BANDAGES/DRESSINGS) ×3 IMPLANT
CHLORAPREP W/TINT 26ML (MISCELLANEOUS) ×3 IMPLANT
CLAMP CORD UMBIL (MISCELLANEOUS) IMPLANT
CLOSURE WOUND 1/2 X4 (GAUZE/BANDAGES/DRESSINGS) ×1
DRSG OPSITE POSTOP 4X10 (GAUZE/BANDAGES/DRESSINGS) ×3 IMPLANT
ELECT REM PT RETURN 9FT ADLT (ELECTROSURGICAL) ×3
ELECTRODE REM PT RTRN 9FT ADLT (ELECTROSURGICAL) ×1 IMPLANT
EXTRACTOR VACUUM M CUP 4 TUBE (SUCTIONS) IMPLANT
EXTRACTOR VACUUM M CUP 4' TUBE (SUCTIONS)
GAUZE SPONGE 4X4 12PLY STRL LF (GAUZE/BANDAGES/DRESSINGS) ×6 IMPLANT
GLOVE BIOGEL PI IND STRL 6.5 (GLOVE) ×1 IMPLANT
GLOVE BIOGEL PI IND STRL 7.0 (GLOVE) ×1 IMPLANT
GLOVE BIOGEL PI INDICATOR 6.5 (GLOVE) ×2
GLOVE BIOGEL PI INDICATOR 7.0 (GLOVE) ×2
GLOVE SURG SS PI 6.0 STRL IVOR (GLOVE) ×3 IMPLANT
GOWN STRL REUS W/TWL LRG LVL3 (GOWN DISPOSABLE) ×6 IMPLANT
KIT ABG SYR 3ML LUER SLIP (SYRINGE) IMPLANT
NEEDLE HYPO 25X5/8 SAFETYGLIDE (NEEDLE) IMPLANT
NS IRRIG 1000ML POUR BTL (IV SOLUTION) ×3 IMPLANT
PACK C SECTION WH (CUSTOM PROCEDURE TRAY) ×3 IMPLANT
PAD ABD 7.5X8 STRL (GAUZE/BANDAGES/DRESSINGS) ×3 IMPLANT
PAD OB MATERNITY 4.3X12.25 (PERSONAL CARE ITEMS) ×3 IMPLANT
PENCIL SMOKE EVAC W/HOLSTER (ELECTROSURGICAL) ×3 IMPLANT
RTRCTR C-SECT PINK 25CM LRG (MISCELLANEOUS) IMPLANT
SEPRAFILM MEMBRANE 5X6 (MISCELLANEOUS) IMPLANT
STRIP CLOSURE SKIN 1/2X4 (GAUZE/BANDAGES/DRESSINGS) ×2 IMPLANT
SUT PLAIN 0 NONE (SUTURE) IMPLANT
SUT VIC AB 0 CT1 36 (SUTURE) ×12 IMPLANT
SUT VIC AB 4-0 KS 27 (SUTURE) ×3 IMPLANT
TAPE CLOTH SURG 4X10 WHT LF (GAUZE/BANDAGES/DRESSINGS) ×3 IMPLANT
TOWEL OR 17X24 6PK STRL BLUE (TOWEL DISPOSABLE) ×3 IMPLANT
TRAY FOLEY W/BAG SLVR 14FR LF (SET/KITS/TRAYS/PACK) ×3 IMPLANT
WATER STERILE IRR 1000ML POUR (IV SOLUTION) ×3 IMPLANT

## 2018-12-07 NOTE — Progress Notes (Signed)
Penn [place]ne unavailable for SBAR report at this time, requested it be done at bedside upon arrival to floor.

## 2018-12-07 NOTE — Anesthesia Procedure Notes (Signed)
Spinal  Patient location during procedure: OR Start time: 12/07/2018 3:35 PM End time: 12/07/2018 3:45 PM Staffing Anesthesiologist: Murvin Natal, MD Performed: anesthesiologist and other anesthesia staff  Preanesthetic Checklist Completed: patient identified, surgical consent, pre-op evaluation, timeout performed, IV checked, risks and benefits discussed and monitors and equipment checked Spinal Block Patient position: sitting Prep: DuraPrep Patient monitoring: cardiac monitor, continuous pulse ox and blood pressure Approach: midline Location: L4-5 Injection technique: single-shot Needle Needle type: Pencan  Needle gauge: 24 G Needle length: 9 cm Assessment Sensory level: T10 Additional Notes Functioning IV was confirmed and monitors were applied. Sterile prep and drape, including hand hygiene and sterile gloves were used. The patient was positioned and the spine was prepped. The skin was anesthetized with lidocaine.  Free flow of clear CSF was obtained prior to injecting local anesthetic into the CSF.  The spinal needle aspirated freely following injection.  The needle was carefully withdrawn.  Procedure performed with CRNA student. The patient tolerated the procedure well.

## 2018-12-07 NOTE — Anesthesia Postprocedure Evaluation (Signed)
Anesthesia Post Note  Patient: Danielle Crawford  Procedure(s) Performed: CESAREAN SECTION (N/A )     Patient location during evaluation: PACU Anesthesia Type: Spinal Level of consciousness: oriented and awake and alert Pain management: pain level controlled Vital Signs Assessment: post-procedure vital signs reviewed and stable Respiratory status: spontaneous breathing, respiratory function stable and nonlabored ventilation Cardiovascular status: blood pressure returned to baseline and stable Postop Assessment: no headache, no backache, no apparent nausea or vomiting, spinal receding and patient able to bend at knees Anesthetic complications: no    Last Vitals:  Vitals:   12/07/18 1800 12/07/18 1815  BP: (!) 107/55 125/74  Pulse: 71 62  Resp: 14 14  Temp:  36.7 C  SpO2: 98% 98%    Last Pain:  Vitals:   12/07/18 1815  TempSrc: Oral  PainSc:    Pain Goal: Patients Stated Pain Goal: 2 (12/06/18 2320)  LLE Motor Response: Purposeful movement (12/07/18 1815) LLE Sensation: Tingling (12/07/18 1815) RLE Motor Response: Purposeful movement (12/07/18 1815) RLE Sensation: Tingling (12/07/18 1815)     Epidural/Spinal Function Cutaneous sensation: Tingles (12/07/18 1815), Patient able to flex knees: Yes (12/07/18 1815), Patient able to lift hips off bed: No (12/07/18 1815), Back pain beyond tenderness at insertion site: No (12/07/18 1815), Progressively worsening motor and/or sensory loss: No (12/07/18 1815), Bowel and/or bladder incontinence post epidural: No (12/07/18 1815)  Amenda Duclos A.

## 2018-12-07 NOTE — Op Note (Signed)
Danielle Crawford PROCEDURE DATE: 12/06/2018 - 12/07/2018  PREOPERATIVE DIAGNOSIS: Intrauterine pregnancy at  2723w0d weeks gestation; di-di twin pregnancy with discordant growth and larger twin B in breech presentation  POSTOPERATIVE DIAGNOSIS: The same  PROCEDURE:     Cesarean Section  SURGEON:  Dr. Catalina AntiguaPeggy Sunday Crawford  ASSISTANT: Dr. Dene Crawford  INDICATIONS: Danielle Crawford is a 23 y.o. G2P1001 at 4223w0d scheduled for cesarean section secondary to dio-di twin pregnancy with twin discordant and fetal malpresentation in large twin B.  The risks of cesarean section discussed with the patient included but were not limited to: bleeding which may require transfusion or reoperation; infection which may require antibiotics; injury to bowel, bladder, ureters or other surrounding organs; injury to the fetus; need for additional procedures including hysterectomy in the event of a life-threatening hemorrhage; placental abnormalities wth subsequent pregnancies, incisional problems, thromboembolic phenomenon and other postoperative/anesthesia complications. The patient concurred with the proposed plan, giving informed written consent for the procedure.    FINDINGS:  Viable female and female infant both delivered in cephalic presentation.  Apgars 8 and 8 Twin A and Apgars unavailable for twin B at the time of note.  Clear amniotic fluid x 2.  Intact placenta, three vessel cord x 2.  Normal uterus, fallopian tubes and ovaries bilaterally.  ANESTHESIA:    Spinal INTRAVENOUS FLUIDS:2400 ml ESTIMATED BLOOD LOSS: 1052 ml URINE OUTPUT:  50 ml SPECIMENS: Placenta sent to pathology COMPLICATIONS: None immediate  PROCEDURE IN DETAIL:  The patient received intravenous antibiotics and had sequential compression devices applied to her lower extremities while in the preoperative area.  She was then taken to the operating room where anesthesia was induced and was found to be adequate. A foley catheter was placed into her bladder  and attached to Danielle Crawford gravity. She was then placed in a dorsal supine position with a leftward tilt, and prepped and draped in a sterile manner. After an adequate timeout was performed, a Pfannenstiel skin incision was made with scalpel and carried through to the underlying layer of fascia. The fascia was incised in the midline and this incision was extended bilaterally using the Mayo scissors. Kocher clamps were applied to the superior aspect of the fascial incision and the underlying rectus muscles were dissected off bluntly. A similar process was carried out on the inferior aspect of the facial incision. The rectus muscles were separated in the midline bluntly and the peritoneum was entered bluntly. The Alexis self-retaining retractor was introduced into the abdominal cavity. Attention was turned to the lower uterine segment where a bladder flap was created, and a transverse hysterotomy was made with a scalpel and extended bilaterally bluntly. The infants were successfully delivered with delayed cord clamping performed for 1 minute for each infants.  The cords were clamped and cut and infants were handed over to awaiting neonatology team. Uterine massage was then administered and the placenta delivered intact with three-vessel cord. The uterus was cleared of clot and debris.  The hysterotomy was closed with 0 Vicryl in a running locked fashion, and an imbricating layer was also placed with a 0 Vicryl. Overall, excellent hemostasis was noted. The pelvis copiously irrigated and cleared of all clot and debris. Hemostasis was confirmed on all surfaces.  The peritoneum and the muscles were reapproximated using 0 vicryl interrupted stitches. The fascia was then closed using 0 Vicryl in a running fashion.  The skin was closed in a subcuticular fashion using 3.0 Vicryl. The patient tolerated the procedure well. Sponge, lap, instrument and  needle counts were correct x 2. She was taken to the recovery room in stable  condition.    Danielle Crawford  12/07/2018 4:44 PM

## 2018-12-07 NOTE — Discharge Summary (Signed)
OB Discharge Summary     Patient Name: Danielle Crawford DOB: 06/15/1995 MRN: [id number]  Date of admission: 12/06/2018 Delivering MD:    Danielle Crawford, Danielle Crawford [facility]Danielle Crawford [[id number]][[id number]]  Danielle Crawford, Danielle Crawford    Danielle Crawford, Danielle Crawford [[id number]][[id number]]  Danielle Crawford, Danielle Crawford    Date of discharge: 12/10/2018  Admitting diagnosis: direct admit Intrauterine pregnancy: 4344w0d     Secondary diagnosis:  Active Problems:   Twin pregnancy, twins discordant in third trimester   S/P cesarean section  Additional problems: Anemia     Discharge diagnosis: Preterm Pregnancy Delivered                                                                                                Post partum procedures: None   Augmentation: None  Complications: None  Crawford course:  Sceduled C/S   23 y.o. yo G2P1001 at 7444w0d was admitted to the Crawford 12/06/2018 for scheduled cesarean section with the following indication:Malpresentation and Di-Di pregnancy (malpresentation of Twin B, Discordance).  Membrane Rupture Time/Date:    Danielle Crawford, Danielle Crawford [[id number]]  3:50 PM    Danielle Crawford, Danielle Crawford Danielle Crawford [[id number]][[id number]]  4:10 PM  ,   [facility] Crawford, [facility] [[id number]][[id number]]  12/07/2018    Danielle Crawford, Danielle Crawford Danielle Crawford [[id number]][[id number]]  12/07/2018    Patient delivered two Viable infants.   Danielle Crawford, [facility]le Crawford [facility] [[id number]][[id number]]  12/07/2018    Danielle Crawford, Danielle Crawford Danielle Crawford [[id number]][[id number]]  12/07/2018   Details of operation can be found in separate operative note.  Pateint had an uncomplicated postpartum course.  She is ambulating, tolerating a regular diet, passing flatus, and urinating well. Patient is discharged home in stable condition on  12/10/18         Physical exam  Vitals:   12/09/18 0524 12/09/18 1523 12/09/18 2300 12/10/18 0609  BP: 131/77 (!) 116/59 119/70 125/74  Pulse:  65 66 61  Resp: 19 17 18 19   Temp: 98.2 F (36.8 C) 98.5 F (36.9 C) 98.4 F (36.9 C) 98 F (36.7 C)  TempSrc: Oral Oral  Oral  SpO2: 97% 98% 97% 96%  Weight:      Height:        General: alert, cooperative and no distress Lochia: appropriate Uterine Fundus: firm Incision: Healing well with no significant drainage, No significant erythema, Dressing is clean, dry, and intact DVT Evaluation: No evidence of DVT seen on physical exam. Negative Homan's sign. No cords or calf tenderness. No significant calf/ankle edema. Labs: Lab Results  Component Value Date   WBC 12.8 (H) 12/09/2018   HGB 6.7 (LL) 12/09/2018   HCT 22.1 (L) 12/09/2018   MCV 79.5 (L) 12/09/2018   PLT 180 12/09/2018   CMP Latest Ref Rng & Units 12/08/2018  Creatinine 0.44 - 1.00 mg/dL 1.880.81    Discharge instruction: per After Visit Summary and "Baby and Me Booklet".  After visit meds:  FeSO4 BID  Oxycodone 5-10 mg every 6 hrs prn severe pain PNV Ibuprofen 600 mg every 6 hrs prn Colace 100 mg BID  Diet: routine diet  Activity: Advance as tolerated. Pelvic rest for 6 weeks.   Outpatient follow up:4 weeks Follow up  Appt:No future appointments.  Please schedule this patient for Postpartum visit in: 4 weeks with the following provider: MD For C/S patients schedule nurse incision check in weeks 2 weeks: yes High risk pregnancy complicated by: Di-Di twins with discordant growth Delivery mode:  CS Anticipated Birth Control:  other/unsure PP Procedures needed: Incision check  Schedule Integrated BH visit: no  Follow up Visit:No follow-ups on file.  Postpartum contraception: Vasectomy  Newborn Data:   Danielle, Crawford [[id number]]  Live born female  Birth Weight:   APGAR: 48, 8  Newborn Delivery   Birth date/time: 12/07/2018 16:08:00 Delivery type: C-Section, Low Transverse Trial of labor: No C-section categorization: Primary       Danielle, Crawford [[id number]]  Live born female  Birth Weight:   APGAR: ,   Newborn Delivery   Birth date/time: 12/07/2018 16:11:00 Delivery type: C-Section, Low Transverse C-section categorization: Primary      Baby Feeding:  Breast Disposition:home with mother   12/10/2018 Danielle Dom Driver, MD  I confirm that I have verified the information documented in the resident's note and that I have also personally reperformed the history, physical exam and all medical decision making activities of this service and have verified that all service and findings are accurately documented in this student's note.   Rx sent to pharmacy   Danielle Crawford, CNM 12/10/2018 11:30 AM

## 2018-12-07 NOTE — Progress Notes (Signed)
Patient ID: Danielle Crawford, female   DOB: [birth year], 23 y.o.   MRN: [id number] FACULTY PRACTICE ANTEPARTUM(COMPREHENSIVE) NOTE  Danielle Crawford is a 23 y.o. G2P1001 at [redacted]w[redacted]d  who is admitted for di-di twins with discordant growth.    Fetal presentation is cephalic/breech. Length of Stay:  1  Days  Date of admission:12/06/2018  Subjective: Patient reports feeling well and is without complaints Patient reports the fetal movement as active x 2 Patient reports uterine contraction  activity as none. Patient reports  vaginal bleeding as none. Patient describes fluid per vagina as None.  Vitals:  Blood pressure 125/75, pulse 60, temperature 98.4 F (36.9 C), resp. rate 16, height 5\' 5"  (1.651 m), weight 78 kg, last menstrual period 03/30/2018, SpO2 97 %. Vitals:   12/06/18 1910 12/06/18 2145 12/06/18 2318 12/07/18 0717  BP: 113/65  109/70 125/75  Pulse: 84 72 68 60  Resp: 18 18 18 16   Temp: 98.2 F (36.8 C)  98.1 F (36.7 C) 98.4 F (36.9 C)  TempSrc: Oral  Oral   SpO2: 99%  97%   Weight:      Height:       Physical Examination: GENERAL: Well-developed, well-nourished female in no acute distress.  LUNGS: Clear to auscultation bilaterally.  HEART: Regular rate and rhythm. ABDOMEN: Soft, nontender, gravid PELVIC: Not indicated EXTREMITIES: No cyanosis, clubbing, or edema, 2+ distal pulses.  Fetal Monitoring:  Baseline 125/130, mod variability x 2, +accels x 2, no decels x 2    Labs:  Results for orders placed or performed during the hospital encounter of 12/06/18 (from the past 24 hour(s))  SARS Coronavirus 2 [facility] order, Performed in [facility] hospital lab) Nasopharyngeal Nasopharyngeal Swab   Collection Time: 12/06/18  4:07 PM   Specimen: Nasopharyngeal Swab  Result Value Ref Range   SARS Coronavirus 2 NEGATIVE NEGATIVE  CBC on admission   Collection Time: 12/06/18  5:05 PM  Result Value Ref Range   WBC 7.4 4.0 - 10.5 K/uL   RBC 3.58 (L) 3.87 - 5.11 MIL/uL    Hemoglobin 8.8 (L) 12.0 - 15.0 g/dL   HCT 40.9 (L) 81.1 - 91.4 %   MCV 77.4 (L) 80.0 - 100.0 fL   MCH 24.6 (L) 26.0 - 34.0 pg   MCHC 31.8 30.0 - 36.0 g/dL   RDW 78.2 95.6 - 21.3 %   Platelets 158 150 - 400 K/uL   nRBC 0.3 (H) 0.0 - 0.2 %  Type and screen [facility]   Collection Time: 12/06/18  5:07 PM  Result Value Ref Range   ABO/RH(D) O POS    Antibody Screen NEG    Sample Expiration      12/09/2018,2359 Performed at [facility], [address] [place], [place] [address]   Results for orders placed or performed during the hospital encounter of 12/07/18 (from the past 24 hour(s))  ABO/Rh   Collection Time: 12/06/18  5:07 PM  Result Value Ref Range   ABO/RH(D)      O POS Performed at [facility], [address] [place], [place] [address]     Imaging Studies:    [place] Mfm Ob Follow Up  Result Date: 12/06/2018 ----------------------------------------------------------------------  OBSTETRICS REPORT                       (Signed Final 12/06/2018 03:06 pm) ---------------------------------------------------------------------- Patient Info  ID #:       [id number]  D.O.B.:  Sep 12, 1995 (23 yrs)  Name:       Danielle Crawford                Visit Date: 12/06/2018 12:48 pm ---------------------------------------------------------------------- Performed By  Performed By:     Tomma Lightning             Ref. Address:     [address]                    [address]                                                             Rd  Attending:        Ma Rings MD         [facility]  Referred By:      [facility] [place] ---------------------------------------------------------------------- Orders   #  Description                          Code         Ordered By   1  [place] MFM OB FOLLOW UP                  76816.01     RAVI SHANKAR   2  [place] MFM OB FOLLOW UP ADDL              78469.62     RAVI SHANKAR      GEST  ----------------------------------------------------------------------   #  Order #                    Accession #                 Episode #   1  [id number]                  [id number]                  [id number]   2  [id number]                  [id number]                  [id number]  ---------------------------------------------------------------------- Indications   Twin pregnancy, di/di, third trimester         O30.043   [redacted] weeks gestation of pregnancy                Z3A.35  ---------------------------------------------------------------------- Vital Signs  Weight (lb): 167                               Height:        5'5"  BMI:         27.79 ---------------------------------------------------------------------- Fetal Evaluation (Fetus  A)  Num Of Fetuses:         2  Fetal Heart Rate(bpm):  123  Cardiac Activity:       Observed  Fetal Lie:              Maternal left side  Presentation:           Cephalic  Placenta:               Anterior  P. Cord Insertion:      Previously Visualized  Membrane Desc:      Dividing Membrane seen  Amniotic Fluid  AFI FV:      Within normal limits                              Largest Pocket(cm)                              5.28 ---------------------------------------------------------------------- Biophysical Evaluation (Fetus A)  Amniotic F.V:   Within normal limits       F. Tone:        Not Observed  F. Movement:    Observed                   Score:          4/8  F. Breathing:   Not Observed ---------------------------------------------------------------------- Biometry (Fetus A)  BPD:      80.5  mm     G. Age:  32w 2d          1  %    CI:        78.98   %    70 - 86                                                          FL/HC:      22.9   %    20.1 - 22.1  HC:      286.4  mm     G. Age:  31w 3d          0  %    HC/AC:      1.08        0.93 - 1.11  AC:      266.1  mm     G. Age:  30w 5d          0  %    FL/BPD:     81.5   %    71 - 87  FL:        65.6  mm     G. Age:  33w 6d          6  %    FL/AC:      24.7   %    20 - 24  HUM:      54.8  mm     G. Age:  31w 6d        < 5  %  Est. FW:    1860  gm      4 lb 2 oz      0  %     FW Discordancy        45  % ----------------------------------------------------------------------  OB History  Gravidity:    2         Term:   1        Prem:   0        SAB:   0  TOP:          0       Ectopic:  0        Living: 1 ---------------------------------------------------------------------- Gestational Age (Fetus A)  LMP:           35w 6d        Date:  03/30/18                 EDD:   01/04/19  U/S Today:     32w 1d                                        EDD:   01/30/19  Best:          35w 6d     Det. By:  LMP  (03/30/18)          EDD:   01/04/19 ---------------------------------------------------------------------- Doppler - Fetal Vessels (Fetus A)  Umbilical Artery   S/D     %tile                                            ADFV    RDFV  3.68       96                                                No      No ---------------------------------------------------------------------- Fetal Evaluation (Fetus B)  Num Of Fetuses:         2  Fetal Heart Rate(bpm):  118  Cardiac Activity:       Observed  Fetal Lie:              Upper Fetus  Presentation:           Transverse, head to maternal right  Placenta:               Anterior  P. Cord Insertion:      Previously Visualized  Membrane Desc:      Dividing Membrane seen  Amniotic Fluid  AFI FV:      Within normal limits                              Largest Pocket(cm)                              7.48 ---------------------------------------------------------------------- Biophysical Evaluation (Fetus B)  Amniotic F.V:   Within normal limits       F. Tone:        Observed  F. Movement:    Observed                   Score:          8/8  F. Breathing:   Observed ---------------------------------------------------------------------- Biometry (Fetus B)  BPD:  88.3  mm     G. Age:  35w  5d         53  %    CI:        70.53   %    70 - 86                                                          FL/HC:      21.4   %    20.1 - 22.1  HC:      335.2  mm     G. Age:  38w 3d         79  %    HC/AC:      0.96        0.93 - 1.11  AC:      350.1  mm     G. Age:  38w 6d       > 99  %    FL/BPD:     81.1   %    71 - 87  FL:       71.6  mm     G. Age:  36w 5d         67  %    FL/AC:      20.5   %    20 - 24  HUM:      62.4  mm     G. Age:  36w 1d         69  %  Est. FW:    3353  gm      7 lb 6 oz     95  %     FW Discordancy     0 \ 45 % ---------------------------------------------------------------------- Gestational Age (Fetus B)  LMP:           35w 6d        Date:  03/30/18                 EDD:   01/04/19  U/S Today:     37w 3d                                        EDD:   12/24/18  Best:          35w 6d     Det. By:  LMP  (03/30/18)          EDD:   01/04/19 ---------------------------------------------------------------------- Doppler - Fetal Vessels (Fetus B)  Umbilical Artery   S/D     %tile                                            ADFV    RDFV  2.42       51                                                No      No ----------------------------------------------------------------------  Cervix Uterus Adnexa  Cervix  Not visualized (advanced GA >24wks) ---------------------------------------------------------------------- Comments  This patient was seen for follow-up growth scan due to a set  of dichorionic, diamniotic twins.  The patient denies any  problems since her last exam and reports feeling vigorous  fetal movements throughout the day.  Based on her prior  ultrasound exams, there has always been a growth  discordance of about 1 pound between twin A and twin B.  On today's exam, the overall EFW for twin A measured 4  pounds 2 ounces.  There was normal amniotic fluid noted  around twin A.  The fetal biometry measurements for twin A  were difficult to obtain due to the fetal position.  The overall  EFW obtained for twin B was over 7 pounds.  There was normal amniotic fluid noted around twin B.  Doppler studies of the umbilical arteries performed today  showed an elevated S/D ratio of 3.68 for twin A and a normal  S/D ratio of 2.4 for twin B.  A biophysical profile performed for twin A was 4 out of 8  (there was -2 for absent fetal tone and -2 for absent fetal  breathing movements).  The biophysical profile was 8 out of 8  for twin B.  Due to the significant growth discordance noted between the  two fetuses and due to the biophysical profile obtained for  twin A today, the patient was sent to the hospital for  admission immediately following today's exam to receive a  complete course of steroids.  The patient should be placed  on the fetal heart rate monitor for a few hours.  If the fetal  statuses are reassuring, she may be taken off the monitor.  She should have an NST per shift while hospitalized. She  should receive the second dose of steroids tomorrow  morning.  Delivery may be considered tomorrow afternoon,  about 6 to 8 hours after she receives the second dose of  steroids.  The fetuses are in the vertex / breech presentation with the  presenting twin being the smaller fetus.  Due to this finding,  the patient was advised that she will most likely require a  cesarean delivery.  She understands that there is a possibility  that the babies may require a NICU admission. ----------------------------------------------------------------------                   Ma Rings, MD Electronically Signed Final Report   12/06/2018 03:06 pm ----------------------------------------------------------------------  [place] Mfm Ob Follow Up Addl Gest  Result Date: 12/06/2018 ----------------------------------------------------------------------  OBSTETRICS REPORT                       (Signed Final 12/06/2018 03:06 pm) ---------------------------------------------------------------------- Patient Info  ID #:       [id number]                           D.O.B.:  1995-09-22 (23 yrs)  Name:       Irven Coe                Visit Date: 12/06/2018 12:48 pm ---------------------------------------------------------------------- Performed By  Performed By:     Tomma Lightning             Ref. Address:     [address] [facility]  [address]                                                             R[address]  Attending:        Ma Rings MD         [facility]  Referred By:      [facility] [place] ---------------------------------------------------------------------- Orders   #  Description                          Code         Ordered By   1  [place] MFM OB FOLLOW UP                  [id number]     RAVI SHANKAR   2  [place] MFM OB FOLLOW UP ADDL             16109.60     RAVI SHANKAR      GEST  ----------------------------------------------------------------------   #  Order #                    Accession #                 Episode #   1  [id number]                  [id number]   2  [id number]                  [id number]                  [id number]  ---------------------------------------------------------------------- Indications   Twin pregnancy, di/di, third trimester         O30.043   [redacted] weeks gestation of pregnancy                Z3A.35  ---------------------------------------------------------------------- Vital Signs  Weight (lb): 167                               Height:        5'5"  BMI:         27.79 ---------------------------------------------------------------------- Fetal Evaluation (Fetus A)  Num Of Fetuses:         2  Fetal Heart Rate(bpm):  123  Cardiac Activity:       Observed  Fetal Lie:              Maternal left side  Presentation:           Cephalic  Placenta:               Anterior  P. Cord Insertion:      Previously  Visualized  Membrane Desc:      Dividing Membrane seen  Amniotic Fluid  AFI FV:      Within normal limits                               Largest Pocket(cm)                              5.28 ---------------------------------------------------------------------- Biophysical Evaluation (Fetus A)  Amniotic F.V:   Within normal limits       F. Tone:        Not Observed  F. Movement:    Observed                   Score:          4/8  F. Breathing:   Not Observed ---------------------------------------------------------------------- Biometry (Fetus A)  BPD:      80.5  mm     G. Age:  32w 2d          1  %    CI:        78.98   %    70 - 86                                                          FL/HC:      22.9   %    20.1 - 22.1  HC:      286.4  mm     G. Age:  31w 3d          0  %    HC/AC:      1.08        0.93 - 1.11  AC:      266.1  mm     G. Age:  30w 5d          0  %    FL/BPD:     81.5   %    71 - 87  FL:       65.6  mm     G. Age:  33w 6d          6  %    FL/AC:      24.7   %    20 - 24  HUM:      54.8  mm     G. Age:  31w 6d        < 5  %  Est. FW:    1860  gm      4 lb 2 oz      0  %     FW Discordancy        45  % ---------------------------------------------------------------------- OB History  Gravidity:    2         Term:   1        Prem:   0        SAB:   0  TOP:          0       Ectopic:  0        Living: 1 ---------------------------------------------------------------------- Gestational Age (Fetus A)  LMP:  35w 6d        Date:  03/30/18                 EDD:   01/04/19  U/S Today:     32w 1d                                        EDD:   01/30/19  Best:          35w 6d     Det. By:  LMP  (03/30/18)          EDD:   01/04/19 ---------------------------------------------------------------------- Doppler - Fetal Vessels (Fetus A)  Umbilical Artery   S/D     %tile                                            ADFV    RDFV  3.68       96                                                No      No ---------------------------------------------------------------------- Fetal Evaluation (Fetus B)  Num Of Fetuses:          2  Fetal Heart Rate(bpm):  118  Cardiac Activity:       Observed  Fetal Lie:              Upper Fetus  Presentation:           Transverse, head to maternal right  Placenta:               Anterior  P. Cord Insertion:      Previously Visualized  Membrane Desc:      Dividing Membrane seen  Amniotic Fluid  AFI FV:      Within normal limits                              Largest Pocket(cm)                              7.48 ---------------------------------------------------------------------- Biophysical Evaluation (Fetus B)  Amniotic F.V:   Within normal limits       F. Tone:        Observed  F. Movement:    Observed                   Score:          8/8  F. Breathing:   Observed ---------------------------------------------------------------------- Biometry (Fetus B)  BPD:      88.3  mm     G. Age:  35w 5d         53  %    CI:        70.53   %    70 - 86  FL/HC:      21.4   %    20.1 - 22.1  HC:      335.2  mm     G. Age:  38w 3d         79  %    HC/AC:      0.96        0.93 - 1.11  AC:      350.1  mm     G. Age:  38w 6d       > 99  %    FL/BPD:     81.1   %    71 - 87  FL:       71.6  mm     G. Age:  36w 5d         67  %    FL/AC:      20.5   %    20 - 24  HUM:      62.4  mm     G. Age:  36w 1d         69  %  Est. FW:    3353  gm      7 lb 6 oz     95  %     FW Discordancy     0 \ 45 % ---------------------------------------------------------------------- Gestational Age (Fetus B)  LMP:           35w 6d        Date:  03/30/18                 EDD:   01/04/19  U/S Today:     37w 3d                                        EDD:   12/24/18  Best:          35w 6d     Det. By:  LMP  (03/30/18)          EDD:   01/04/19 ---------------------------------------------------------------------- Doppler - Fetal Vessels (Fetus B)  Umbilical Artery   S/D     %tile                                            ADFV    RDFV  2.42       51                                                 No      No ---------------------------------------------------------------------- Cervix Uterus Adnexa  Cervix  Not visualized (advanced GA >24wks) ---------------------------------------------------------------------- Comments  This patient was seen for follow-up growth scan due to a set  of dichorionic, diamniotic twins.  The patient denies any  problems since her last exam and reports feeling vigorous  fetal movements throughout the day.  Based on her prior  ultrasound exams, there has always been a growth  discordance of about 1 pound between twin A and twin B.  On today's exam, the overall EFW for twin A measured 4  pounds 2 ounces.  There was normal amniotic fluid  noted  around twin A.  The fetal biometry measurements for twin A  were difficult to obtain due to the fetal position.  The overall EFW obtained for twin B was over 7 pounds.  There was normal amniotic fluid noted around twin B.  Doppler studies of the umbilical arteries performed today  showed an elevated S/D ratio of 3.68 for twin A and a normal  S/D ratio of 2.4 for twin B.  A biophysical profile performed for twin A was 4 out of 8  (there was -2 for absent fetal tone and -2 for absent fetal  breathing movements).  The biophysical profile was 8 out of 8  for twin B.  Due to the significant growth discordance noted between the  two fetuses and due to the biophysical profile obtained for  twin A today, the patient was sent to the hospital for  admission immediately following today's exam to receive a  complete course of steroids.  The patient should be placed  on the fetal heart rate monitor for a few hours.  If the fetal  statuses are reassuring, she may be taken off the monitor.  She should have an NST per shift while hospitalized. She  should receive the second dose of steroids tomorrow  morning.  Delivery may be considered tomorrow afternoon,  about 6 to 8 hours after she receives the second dose of  steroids.  The fetuses are in the vertex /  breech presentation with the  presenting twin being the smaller fetus.  Due to this finding,  the patient was advised that she will most likely require a  cesarean delivery.  She understands that there is a possibility  that the babies may require a NICU admission. ----------------------------------------------------------------------                   Ma Rings, MD Electronically Signed Final Report   12/06/2018 03:06 pm ----------------------------------------------------------------------    Medications:  Scheduled . docusate sodium  100 mg Oral Daily  . prenatal multivitamin  1 tablet Oral Q1200  . sodium chloride flush  3 mL Intravenous Q12H   I have reviewed the patient's current medications.  ASSESSMENT: G2P1001 [redacted]w[redacted]d Estimated Date of Delivery: 01/04/19  Patient Active Problem List   Diagnosis Date Noted  . Twin pregnancy, twins discordant in third trimester 12/06/2018  . S/P cesarean section 12/06/2018  . Dichorionic diamniotic twin gestation 05/14/2018  . Pregnancy 05/14/2018  . Migraine without aura and with status migrainosus, not intractable 08/19/2016    PLAN: 23 yo with di-di twins, growth discordance and fetal malpresentation with twin B - Patient received her second dose of BMZ this morning - Plan for cesarean section due to fetal malpresentation. Discussed risks of fetal head entrapment with breech extraction of larger twin. Risks, benefits and alternatives were explained including but not limited to risks of bleeding, infection and damage to adjacent organs. Patient verbalized understanding and all questions were answered  Lynnet Hefley 12/07/2018,11:05 AM

## 2018-12-07 NOTE — Progress Notes (Signed)
Pt with SROM-clear fluid. PT prepped and ready for OR. Dr. Elly Modena aware of SROM. Toya Smothers, RN

## 2018-12-07 NOTE — Plan of Care (Signed)
  Problem: Education: Goal: Knowledge of General Education information will improve Description: Including pain rating scale, medication(s)/side effects and non-pharmacologic comfort measures Outcome: Completed/Met   Problem: Clinical Measurements: Goal: Diagnostic test results will improve Outcome: Completed/Met   Problem: Activity: Goal: Risk for activity intolerance will decrease Outcome: Completed/Met   Problem: Nutrition: Goal: Adequate nutrition will be maintained Outcome: Completed/Met   Problem: Elimination: Goal: Will not experience complications related to bowel motility Outcome: Completed/Met   Problem: Pain Managment: Goal: General experience of comfort will improve Outcome: Completed/Met   Problem: Education: Goal: Knowledge of the prescribed therapeutic regimen will improve Outcome: Completed/Met

## 2018-12-07 NOTE — Transfer of Care (Signed)
Immediate Anesthesia Transfer of Care Note  Patient: Danielle Crawford  Procedure(s) Performed: CESAREAN SECTION (N/A )  Patient Location: PACU  Anesthesia Type:Spinal  Level of Consciousness: awake and alert   Airway & Oxygen Therapy: Patient Spontanous Breathing  Post-op Assessment: Report given to RN and Post -op Vital signs reviewed and stable  Post vital signs: Reviewed  Last Vitals:  Vitals Value Taken Time  BP 107/63 12/07/18 1711  Temp    Pulse 72 12/07/18 1714  Resp 9 12/07/18 1714  SpO2 100 % 12/07/18 1714  Vitals shown include unvalidated device data.  Last Pain:  Vitals:   12/07/18 1439  TempSrc: Oral  PainSc:       Patients Stated Pain Goal: 2 (01/75/10 2585)  Complications: No apparent anesthesia complications

## 2018-12-07 NOTE — Anesthesia Preprocedure Evaluation (Addendum)
Anesthesia Evaluation  Patient identified by MRN, date of birth, ID band Patient awake    Reviewed: Allergy & Precautions, NPO status , Patient's Chart, lab work & pertinent test results  Airway Mallampati: I  TM Distance: >3 FB Neck ROM: Full    Dental no notable dental hx.    Pulmonary neg pulmonary ROS,    Pulmonary exam normal breath sounds clear to auscultation       Cardiovascular negative cardio ROS Normal cardiovascular exam Rhythm:Regular Rate:Normal     Neuro/Psych  Headaches, negative psych ROS   GI/Hepatic negative GI ROS, Neg liver ROS,   Endo/Other  negative endocrine ROS  Renal/GU negative Renal ROS     Musculoskeletal negative musculoskeletal ROS (+)   Abdominal   Peds  Hematology  (+) anemia ,   Anesthesia Other Findings Dichorionic diamniotic twin gestation  Reproductive/Obstetrics (+) Pregnancy Previous C-S                            Anesthesia Physical Anesthesia Plan  ASA: II  Anesthesia Plan: Spinal   Post-op Pain Management:    Induction:   PONV Risk Score and Plan: 2 and Ondansetron, Dexamethasone and Treatment may vary due to age or medical condition  Airway Management Planned: Natural Airway  Additional Equipment:   Intra-op Plan:   Post-operative Plan:   Informed Consent: I have reviewed the patients History and Physical, chart, labs and discussed the procedure including the risks, benefits and alternatives for the proposed anesthesia with the patient or authorized representative who has indicated his/her understanding and acceptance.     Dental advisory given  Plan Discussed with: CRNA  Anesthesia Plan Comments:        Anesthesia Quick Evaluation

## 2018-12-08 ENCOUNTER — Encounter (HOSPITAL_COMMUNITY): Payer: Self-pay | Admitting: Obstetrics and Gynecology

## 2018-12-08 LAB — CREATININE, SERUM
Creatinine, Ser: 0.81 mg/dL (ref 0.44–1.00)
GFR calc Af Amer: >60 mL/min (ref >60)
GFR calc non Af Amer: >60 mL/min (ref >60)

## 2018-12-08 LAB — CBC
HCT: 21.3 % — ABNORMAL LOW (ref 36.0–46.0)
Hemoglobin: 6.7 g/dL — CL (ref 12.0–15.0)
MCH: 24.8 pg — ABNORMAL LOW (ref 26.0–34.0)
MCHC: 31.5 g/dL (ref 30.0–36.0)
MCV: 78.9 fL — ABNORMAL LOW (ref 80.0–100.0)
Platelets: 164 10*3/uL (ref 150–400)
RBC: 2.7 MIL/uL — ABNORMAL LOW (ref 3.87–5.11)
RDW: 15.4 % (ref 11.5–15.5)
WBC: 13.4 10*3/uL — ABNORMAL HIGH (ref 4.0–10.5)
nRBC: 0.1 % (ref 0.0–0.2)

## 2018-12-08 MED ORDER — SODIUM CHLORIDE 0.9 % IV SOLN
510.0000 mg | Freq: Once | INTRAVENOUS | Status: AC
  Administered 2018-12-08: 510 mg via INTRAVENOUS
  Filled 2018-12-08: qty 17

## 2018-12-08 MED ORDER — FERROUS SULFATE 325 (65 FE) MG PO TABS
325.0000 mg | ORAL_TABLET | Freq: Two times a day (BID) | ORAL | Status: DC
  Administered 2018-12-08 – 2018-12-10 (×4): 325 mg via ORAL
  Filled 2018-12-08 (×4): qty 1

## 2018-12-08 NOTE — Lactation Note (Signed)
This note was copied from a baby's chart. Lactation Consultation Note Baby boy 62 hrs old, has no interest in BF at this time. Has been spitting up, gagging. Baby boy A is significantly smaller than baby girl B. Neither babies are interested in BF. Both abd. Slightly distended.  Placed baby girl to breast. Baby latched but wouldn't suckle. Clamped down on the nipple. RN syring fed baby boy A formula.  LC gave baby girl B 1 ml colostrum w/spoon. Assessed suck w/gloved finger. Baby girl clamping. Mom stated that is what she doing on the breast. Encouraged  STS. Mom has been holding STS. LPI information sheet given and reviewed. Mom states she wants to pump. Pumping encouraged Q 3 hrs then hand express afterwards. [place] asked RN if she would set up DEBP. Milk storage, breast massage, feeding positioning, BF one twin at a time until babies are feeding better for the next couple of days then can BF both at the same time when she would like.  Mom has a 42 month old that she BF for 6 months. She started taking birth control and her milk dried up. Encouraged mom to rest while babies were resting, set clock for feedings. Call staff for assistance or questions. Lactation brochure given.  Patient Name: Danielle Crawford [id number] Date: 12/08/2018 Reason for consult: Initial assessment;Difficult latch;Infant < 6lbs;Multiple gestation;Late-preterm 34-36.6wks   Maternal Data Has patient been taught Hand Expression?: Yes Does the patient have breastfeeding experience prior to this delivery?: Yes  Feeding Feeding Type: Formula  LATCH Score Latch: Too sleepy or reluctant, no latch achieved, no sucking elicited.  Audible Swallowing: None  Type of Nipple: Everted at rest and after stimulation(inverted centers)  Comfort (Breast/Nipple): Soft / non-tender  Hold (Positioning): Assistance needed to correctly position infant at breast and maintain latch.  LATCH Score: 5  Interventions Interventions:  Breast feeding basics reviewed;Adjust position;Assisted with latch;Support pillows;DEBP;Skin to skin;Position options;Breast massage;Hand express;Expressed milk;Pre-pump if needed;Breast compression  Lactation Tools Discussed/Used Pump Review: Setup, frequency, and cleaning;Milk Storage Initiated by:: RN Date initiated:: 12/08/18   Consult Status Consult Status: Follow-up Date: 12/08/18 Follow-up type: In-patient    Theodoro Kalata 12/08/2018, 12:11 AM

## 2018-12-08 NOTE — Progress Notes (Signed)
POSTPARTUM PROGRESS NOTE  Post Partum Day 1  Subjective:  Danielle Crawford is a 23 y.o. G2P1001 s/p pLTCS at [redacted]w[redacted]d.  No acute events overnight.  Pt denies problems with ambulating, or po intake.  She denies nausea or vomiting.  Pain is well controlled.  She has had flatus.  Lochia Minimal.  Foley catheter in place.   Objective: Blood pressure (!) 104/53, pulse 69, temperature 98.4 F (36.9 C), temperature source Oral, resp. rate 16, height 5\' 5"  (1.651 m), weight 78 kg, last menstrual period 03/30/2018, SpO2 97 %.  Physical Exam:  General: alert, cooperative and no distress Chest: no respiratory distress Heart:regular rate, distal pulses intact Abdomen: soft, nontender,  Uterine Fundus: firm, appropriately tender DVT Evaluation: No calf swelling or tenderness Extremities: None edema Skin: warm, incision dressing clean/dry/intat  Recent Labs    12/06/18 1705 12/08/18 0546  HGB 8.8* 6.7*  HCT 27.7* 21.3*    Assessment/Plan: Danielle Crawford is a 23 y.o. G2P1001 s/p pLTCS at [redacted]w[redacted]d   PPD# 1- Doing well Contraception: Father to get Vasectomy  Feeding: breast and bottle  Dispo: Plan for discharge 12/10/18. # Hgb down from 8.8>6.7. 2 UPRBC recommended. Patient undecided. Would like to think about it and consider other options. She is asymptomatic.    LOS: 2 days   Noni Saupe I, NP 12/08/2018, 10:50 AM

## 2018-12-08 NOTE — Progress Notes (Signed)
CRITICAL VALUE STICKER  CRITICAL VALUE:Hgb 6.7  RECEIVER (on-site recipient of call): Santiago Bur  DATE & TIME NOTIFIED: 12/08/18 0656  MESSENGER (representative from lab): Lanny Hurst  MD NOTIFIED: P[place]am  TIME OF NOTIFICATION:0656  RESPONSE: In delivery, will call back Evangeline Dakin

## 2018-12-09 LAB — CBC
HCT: 22.1 % — ABNORMAL LOW (ref 36.0–46.0)
Hemoglobin: 6.7 g/dL — CL (ref 12.0–15.0)
MCH: 24.1 pg — ABNORMAL LOW (ref 26.0–34.0)
MCHC: 30.3 g/dL (ref 30.0–36.0)
MCV: 79.5 fL — ABNORMAL LOW (ref 80.0–100.0)
Platelets: 180 10*3/uL (ref 150–400)
RBC: 2.78 MIL/uL — ABNORMAL LOW (ref 3.87–5.11)
RDW: 15.3 % (ref 11.5–15.5)
WBC: 12.8 10*3/uL — ABNORMAL HIGH (ref 4.0–10.5)
nRBC: 0.2 % (ref 0.0–0.2)

## 2018-12-09 NOTE — Progress Notes (Signed)
POSTPARTUM PROGRESS NOTE  Post Partum Day 2  Subjective:  Danielle Crawford is a 23 y.o. G2P1001 s/p pLTCS at [redacted]w[redacted]d.  No acute events overnight.  Pt denies problems with ambulating, voiding or po intake.  She denies nausea or vomiting.  Pain is well controlled.  She has had flatus.  Lochia Minimal.   Objective: Blood pressure 131/77, pulse 68, temperature 98.2 F (36.8 C), temperature source Oral, resp. rate 19, height 5\' 5"  (1.651 m), weight 78 kg, last menstrual period 03/30/2018, SpO2 97 %.  Physical Exam:  General: alert, cooperative and no distress Chest: no respiratory distress Heart:regular rate, distal pulses intact Abdomen: soft, nontender,  Uterine Fundus: firm, appropriately tender DVT Evaluation: No calf swelling or tenderness Extremities: No edema Skin: warm, dressing clean/dry/intact  Recent Labs    12/08/18 0546 12/09/18 0506  HGB 6.7* 6.7*  HCT 21.3* 22.1*    Assessment/Plan: Danielle Crawford is a 23 y.o. G2P1001 s/p pLTCS at [redacted]w[redacted]d   PPD# 2- Doing well Contraception: Vasectomy  Feeding: Breast/Bottle  Dispo: Plan for discharge possibly later today depending on Peds. #asymptomatic anemia. Fereheme 9/19- done. Iron oral BID. Patient refused PRBC.   LOS: 3 days   Franchot Pollitt, Artist Pais, NP, 12/09/2018, 10:43 AM

## 2018-12-09 NOTE — Lactation Note (Signed)
This note was copied from a baby's chart. Lactation Consultation Note 40 hrs old. Mom was trying to BF and started having a lot of abd. Pain. Mom went to [facility], passed a clot. RN notified. Mom crying in pain. LC fed baby girl Eden 20 cal. Similac. Took 20 ml w/o difficulty. Baby has a small mouth. Mom stated she didn't BF well this last feeding, but mom was in a lot of pain and uncomfortable. Mom encouraged to take her pain medication for the pain.  Patient Name: Danielle Crawford [id number] Date: 12/09/2018 Reason for consult: Follow-up assessment;Multiple gestation;Late-preterm 34-36.6wks   Maternal Data    Feeding Feeding Type: Formula Nipple Type: Slow - flow  LATCH Score                   Interventions    Lactation Tools Discussed/Used     Consult Status Consult Status: Follow-up Date: 12/10/18 Follow-up type: In-patient    Danielle Crawford, Elta Guadeloupe 12/09/2018, 1:11 AM

## 2018-12-09 NOTE — Lactation Note (Addendum)
This note was copied from a baby's chart. Lactation Consultation Note Baby Boy 18 hrs old. Mom in a lot of pain, crying. Encouraged mom to take pain medication as ordered. Mom has been trying not to take narcotics.  FOB feeding formula w/yellow slow flow nipple. Takes a little work trying to get baby to start feeding. Heard gulping. Baby burps well. Baby doesn't take much when stops.  LC gave purple slow flow nipple  To see if better. No gulping noise heard. Baby took total 9 ml. Then tired. Encouraged to let baby rest then try again to give the formula before the hour was up on the formula. Mom in to much to pump or do anything at this time. Encouraged to rest while the twins rested.  [place] request baby boy have a Speech consult for evaluation to prevent aspiration and increase intake. Baby wt. 4.9lbs. I'm sure wt. Loss will be more when weighed this am. D/t to poor feedings.  Patient Name: Danielle Crawford [id number] Date: 12/09/2018 Reason for consult: Follow-up assessment;Multiple gestation;Infant < 6lbs;Late-preterm 34-36.6wks   Maternal Data    Feeding Feeding Type: Formula Nipple Type: Extra Slow Flow  LATCH Score                   Interventions    Lactation Tools Discussed/Used     Consult Status Consult Status: Follow-up Date: 12/09/18 Follow-up type: In-patient    Danielle Crawford, Danielle Crawford 12/09/2018, 1:22 AM

## 2018-12-10 MED ORDER — IBUPROFEN 800 MG PO TABS: 800.0000 mg | ORAL_TABLET | Freq: Three times a day (TID) | ORAL | 0 refills | Status: DC

## 2018-12-10 MED ORDER — ACETAMINOPHEN 500 MG PO TABS
1000.0000 mg | ORAL_TABLET | Freq: Once | ORAL | Status: AC
  Administered 2018-12-10: 1000 mg via ORAL
  Filled 2018-12-10: qty 2

## 2018-12-10 MED ORDER — OXYCODONE HCL 5 MG PO TABS: 5.0000 mg | ORAL_TABLET | ORAL | 0 refills | Status: DC | PRN

## 2018-12-10 MED ORDER — SIMETHICONE 80 MG PO CHEW: 80.0000 mg | CHEWABLE_TABLET | Freq: Three times a day (TID) | ORAL | 0 refills | Status: AC

## 2018-12-10 MED ORDER — DOCUSATE SODIUM 100 MG PO CAPS: 100.0000 mg | ORAL_CAPSULE | Freq: Two times a day (BID) | ORAL | 0 refills | Status: AC

## 2018-12-10 MED ORDER — FERROUS SULFATE 325 (65 FE) MG PO TABS: 325.0000 mg | ORAL_TABLET | Freq: Two times a day (BID) | ORAL | 3 refills | Status: AC

## 2018-12-10 NOTE — Lactation Note (Signed)
This note was copied from a baby's chart. Lactation Consultation Note  Patient Name: Elianna Windom [id number] Date: 12/10/2018 Reason for consult: Follow-up assessment;Multiple gestation;Infant < 6lbs Babies are 39 hours old.  Baby A is down 2% and Baby B down 8%.  Mom reports that babies are latching well each feeding in football hold.  She feeds babies at the same time.  Mom is also post pumping but milk is not in yet.  Babies are getting formula supplementation.  Instructed to give 30+ mls every 3 hours.  Discussed milk coming to volume and the prevention and treatment of engorgement.  Reviewed late preterm feeding ability and recommended an outpatient appointment for pre and post weights and feeding assessment.  Mom will continue plan until then.  She has a Medela pump in style at home.  Reviewed lactation outpatient services and encouraged to call prn  Maternal Data    Feeding Feeding Type: Bottle Fed - Formula  LATCH Score Latch: Grasps breast easily, tongue down, lips flanged, rhythmical sucking.  Audible Swallowing: A few with stimulation  Type of Nipple: Everted at rest and after stimulation  Comfort (Breast/Nipple): Soft / non-tender  Hold (Positioning): Assistance needed to correctly position infant at breast and maintain latch.  LATCH Score: 8  Interventions    Lactation Tools Discussed/Used     Consult Status Consult Status: Complete Follow-up type: Call as needed    Ave Filter 12/10/2018, 12:45 PM

## 2018-12-11 LAB — SURGICAL PATHOLOGY

## 2018-12-20 ENCOUNTER — Other Ambulatory Visit: Payer: Self-pay

## 2018-12-20 ENCOUNTER — Encounter: Payer: Self-pay | Admitting: Family Medicine

## 2018-12-20 ENCOUNTER — Ambulatory Visit (INDEPENDENT_AMBULATORY_CARE_PROVIDER_SITE_OTHER): Payer: Medicaid Other | Admitting: Family Medicine

## 2018-12-20 VITALS — BP 112/71 | HR 65 | Wt 133.0 lb

## 2018-12-20 DIAGNOSIS — Z4889 Encounter for other specified surgical aftercare: Secondary | ICD-10-CM

## 2018-12-20 NOTE — Progress Notes (Signed)
   Subjective:    Patient ID: Danielle Crawford, female    DOB: Jul 15, 1995, 23 y.o.   MRN: [id number]  HPI Patient seen for wound check. Had c/s almost two weeks ago. No major problems. Having some tenderness and bruising on right side. Not taking pain medicine.  Review of Systems     Objective:   Physical Exam Constitutional:      Appearance: Normal appearance.  Cardiovascular:     Rate and Rhythm: Normal rate.     Pulses: Normal pulses.  Abdominal:     General: Abdomen is flat.     Palpations: Abdomen is soft.     Comments: Incision clean, dry, intact. Healing well. Bruising of right side of incision noticed with slightly firm area - likely hematoma approximately 3-4cm in diameter.  Neurological:     Mental Status: She is alert.       Assessment & Plan:  1. Encounter for post surgical wound check Incision healing well. No concerns. NSAIDs and heat for bruising. F/u in two weeks.

## 2018-12-20 NOTE — Progress Notes (Signed)
Patient here for incision check. Patient states she does notice a know on the right side of the incision. Kathrene Alu RN

## 2019-01-03 ENCOUNTER — Ambulatory Visit: Payer: Medicaid Other | Admitting: Family Medicine

## 2019-01-07 ENCOUNTER — Telehealth: Payer: Self-pay

## 2019-01-07 NOTE — Telephone Encounter (Signed)
Patient called after hours line this weekend. Returned call to patient to offer her appointment for today.  Left message for patient. Kathrene Alu RN

## 2019-01-10 ENCOUNTER — Encounter: Payer: Self-pay | Admitting: Family Medicine

## 2019-01-10 ENCOUNTER — Ambulatory Visit (INDEPENDENT_AMBULATORY_CARE_PROVIDER_SITE_OTHER): Payer: Medicaid Other | Admitting: Family Medicine

## 2019-01-10 ENCOUNTER — Other Ambulatory Visit: Payer: Self-pay

## 2019-01-10 DIAGNOSIS — Z1389 Encounter for screening for other disorder: Secondary | ICD-10-CM | POA: Diagnosis not present

## 2019-01-10 MED ORDER — METOCLOPRAMIDE HCL 10 MG PO TABS: 10.0000 mg | ORAL_TABLET | Freq: Four times a day (QID) | ORAL | 0 refills | Status: DC

## 2019-01-10 NOTE — Progress Notes (Signed)
Post Partum Exam  Danielle Crawford is a 23 y.o. G52P1001 female who presents for a postpartum visit. She is 5 weeks postpartum following a low cervical transverse Cesarean section. I have fully reviewed the prenatal and intrapartum course. The delivery was at 24 gestational weeks.  Anesthesia: epidural. Postpartum course has been uneventful. 37 course has been uneventful though premature twins. Babies are feeding by breast. Bleeding thin lochia. Bowel function is normal. Bladder function is normal. Patient is not sexually active. Contraception method is none. Postpartum depression screening:neg Score 2  Has decreased milk supply despite good nutrition, hydration, and taking PNV.  Last pap smear done Feb 2020 and was Normal  Review of Systems Pertinent items are noted in HPI.    Objective:  unknown if currently breastfeeding.  General:  alert, cooperative and no distress   Breasts:  inspection negative, no nipple discharge or bleeding, no masses or nodularity palpable  Lungs: clear to auscultation bilaterally  Heart:  regular rate and rhythm, S1, S2 normal, no murmur, click, rub or gallop  Abdomen: soft, non-tender; bowel sounds normal; no masses,  no organomegaly and incision well healed. Slight bump on left side, but no erythema.         Assessment:    Normal postpartum exam. Pap smear not done at today's visit.   Plan:   1. Contraception: condoms and vasectomy 2. Reglan for lactation augmentation 3. Follow up in: 1 year or as needed.

## 2019-04-15 MED ORDER — METOCLOPRAMIDE HCL 10 MG PO TABS: 10.0000 mg | ORAL_TABLET | Freq: Three times a day (TID) | ORAL | 3 refills | Status: AC

## 2019-04-15 MED ORDER — PREPLUS 27-1 MG PO TABS: 1.0000 | ORAL_TABLET | Freq: Every day | ORAL | 3 refills | Status: AC

## 2019-07-11 ENCOUNTER — Telehealth (INDEPENDENT_AMBULATORY_CARE_PROVIDER_SITE_OTHER): Payer: Medicaid Other | Admitting: Family Medicine

## 2019-07-11 DIAGNOSIS — O99345 Other mental disorders complicating the puerperium: Secondary | ICD-10-CM | POA: Diagnosis not present

## 2019-07-11 DIAGNOSIS — F53 Postpartum depression: Secondary | ICD-10-CM

## 2019-07-11 MED ORDER — CITALOPRAM HYDROBROMIDE 20 MG PO TABS: 20.0000 mg | ORAL_TABLET | Freq: Every day | ORAL | 12 refills | Status: AC

## 2019-07-11 NOTE — Progress Notes (Signed)
   TELEHEALTH VIRTUAL GYNECOLOGY VISIT ENCOUNTER NOTE  I connected with Danielle Crawford on 07/11/19 at 11:15 AM EDT by telephone at home and verified that I am speaking with the correct person using two identifiers.   I discussed the limitations, risks, security and privacy concerns of performing an evaluation and management service by telephone and the availability of in person appointments. I also discussed with the patient that there may be a patient responsible charge related to this service. The patient expressed understanding and agreed to proceed.   History:  Danielle Crawford is a 24 y.o. G65P1001 female being evaluated today for feelings of anxiety that has been worsening over the past several weeks. She feels that she gets frustrated easily. Doesn't sleep well. Occasionally lays awake at night worrying over things, but only 2 out of 7 nights. Doesn't leave the house much, but feels that this is more to do with the work involved in getting three kids ready. Appetite normal. She denies any abnormal vaginal discharge, bleeding, pelvic pain or other concerns.       Past Medical History:  Diagnosis Date  . Headache    Past Surgical History:  Procedure Laterality Date  . CESAREAN SECTION MULTI-GESTATIONAL N/A 12/07/2018   Procedure: CESAREAN SECTION MULTI-GESTATIONAL;  Surgeon: Catalina Antigua, MD;  Location: [facility] LD ORS;  Service: Obstetrics;  Laterality: N/A;  . WISDOM TOOTH EXTRACTION     The following portions of the patient's history were reviewed and updated as appropriate: allergies, current medications, past family history, past medical history, past social history, past surgical history and problem list.    Review of Systems:  Pertinent items noted in HPI and remainder of comprehensive ROS otherwise negative.  Physical Exam:   General:  Alert, oriented and cooperative.   Mental Status: Normal mood and affect perceived. Normal judgment and thought content.  Physical exam  deferred due to nature of the encounter  Labs and Imaging No results found for this or any previous visit (from the past 336 hour(s)). No results found.    Assessment and Plan:      1. Postpartum depression Discussed treatment options - counseling vs medication. She is open to trying medication. Start Celexa 20mg  daily. Discussed that this may take a week to start seeing benefit from anxiety and 3-4 weeks for depression. Discussed potential side effects, including suicidal thoughts. Patient to seek emergent care for that.       I discussed the assessment and treatment plan with the patient. The patient was provided an opportunity to ask questions and all were answered. The patient agreed with the plan and demonstrated an understanding of the instructions.   The patient was advised to call back or seek an in-person evaluation/go to the ED if the symptoms worsen or if the condition fails to improve as anticipated.  I provided 13 minutes of non-face-to-face time during this encounter.   , DO [facility], [facility]

## 2019-08-02 ENCOUNTER — Other Ambulatory Visit: Payer: Self-pay | Admitting: Family Medicine

## 2019-08-08 ENCOUNTER — Ambulatory Visit: Payer: Medicaid Other | Admitting: Family Medicine

## 2019-11-25 ENCOUNTER — Other Ambulatory Visit: Payer: Self-pay

## 2019-11-25 ENCOUNTER — Emergency Department (HOSPITAL_BASED_OUTPATIENT_CLINIC_OR_DEPARTMENT_OTHER)
Admission: EM | Admit: 2019-11-25 | Discharge: 2019-11-25 | Disposition: A | Discharge status: Home / Self Care | Payer: Medicaid Other | Attending: Emergency Medicine | Admitting: Emergency Medicine

## 2019-11-25 ENCOUNTER — Emergency Department (HOSPITAL_BASED_OUTPATIENT_CLINIC_OR_DEPARTMENT_OTHER): Payer: Medicaid Other

## 2019-11-25 ENCOUNTER — Encounter (HOSPITAL_BASED_OUTPATIENT_CLINIC_OR_DEPARTMENT_OTHER): Payer: Self-pay | Admitting: Emergency Medicine

## 2019-11-25 DIAGNOSIS — Y998 Other external cause status: Secondary | ICD-10-CM | POA: Insufficient documentation

## 2019-11-25 DIAGNOSIS — S42021A Displaced fracture of shaft of right clavicle, initial encounter for closed fracture: Secondary | ICD-10-CM | POA: Insufficient documentation

## 2019-11-25 DIAGNOSIS — Z79899 Other long term (current) drug therapy: Secondary | ICD-10-CM | POA: Diagnosis not present

## 2019-11-25 DIAGNOSIS — Y9289 Other specified places as the place of occurrence of the external cause: Secondary | ICD-10-CM | POA: Diagnosis not present

## 2019-11-25 DIAGNOSIS — Y939 Activity, unspecified: Secondary | ICD-10-CM | POA: Insufficient documentation

## 2019-11-25 DIAGNOSIS — S4991XA Unspecified injury of right shoulder and upper arm, initial encounter: Secondary | ICD-10-CM | POA: Diagnosis present

## 2019-11-25 MED ORDER — NAPROXEN 375 MG PO TABS: 375.0000 mg | ORAL_TABLET | Freq: Two times a day (BID) | ORAL | 0 refills | Status: AC
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
## 2019-11-26 ENCOUNTER — Other Ambulatory Visit: Payer: Self-pay | Admitting: Orthopaedic Surgery

## 2019-11-26 ENCOUNTER — Telehealth: Payer: Self-pay

## 2019-11-26 MED ORDER — HYDROCODONE-ACETAMINOPHEN 5-325 MG PO TABS
1.0000 | ORAL_TABLET | Freq: Four times a day (QID) | ORAL | 0 refills | Status: DC | PRN
Start: 2019-11-26 — End: 2019-12-06

## 2019-11-26 NOTE — Telephone Encounter (Signed)
Danielle Crawford scheduled her for this Thursday, do you want to see her then? Or move it out since an xray just a couple days later won't be any different that quickly

## 2019-11-26 NOTE — Telephone Encounter (Signed)
Patient would like to know how long will she be in discomfort and in the sling for right clavicle Fx?   Stated that she is having some rubbing and popping.  Would like to know when will the Naproxen start to work and ROM?  She is having some stomach discomfort with the Naproxen.  Cb# [phone].  Please advise.  Thank you.

## 2019-11-26 NOTE — Telephone Encounter (Signed)
Will need to be Monday or Tuesday next week.

## 2019-11-26 NOTE — Telephone Encounter (Signed)
I did send in some hydrocodone to her pharmacy to see if that would help with her pain.  Let her know that any broken bone can take anywhere from 6 to 12 weeks to completely heal.  The pain that she is experiencing right now is normal for having a clavicle fracture.  She needs a limited motion with that shoulder and arm and certainly ice the shoulder and clavicle area as needed intermittently throughout the day.  The feelings that she is having in terms of the rubbing and popping is normal with a broken collarbone.  Able to take about 3 weeks for the bone to start the healing process in terms of feeling better overall.  We will get new x-rays of the clavicle when she comes in for follow-up.  The x-rays yesterday showed the fracture and thus far, we will treat this nonoperative with just time and less the fracture angulates more or becomes significantly worse.

## 2019-11-27 NOTE — Telephone Encounter (Signed)
Patient aware and scheduled for Tuesday

## 2019-12-03 ENCOUNTER — Ambulatory Visit: Payer: Medicaid Other | Admitting: Orthopaedic Surgery

## 2019-12-04 ENCOUNTER — Ambulatory Visit (INDEPENDENT_AMBULATORY_CARE_PROVIDER_SITE_OTHER): Payer: Medicaid Other | Admitting: Orthopaedic Surgery

## 2019-12-04 ENCOUNTER — Ambulatory Visit (INDEPENDENT_AMBULATORY_CARE_PROVIDER_SITE_OTHER): Payer: Medicaid Other

## 2019-12-04 ENCOUNTER — Encounter: Payer: Self-pay | Admitting: Orthopaedic Surgery

## 2019-12-04 DIAGNOSIS — S42001A Fracture of unspecified part of right clavicle, initial encounter for closed fracture: Secondary | ICD-10-CM | POA: Diagnosis not present
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
## 2019-12-06 ENCOUNTER — Telehealth: Payer: Self-pay

## 2019-12-06 ENCOUNTER — Other Ambulatory Visit: Payer: Self-pay | Admitting: Physician Assistant

## 2019-12-06 MED ORDER — HYDROCODONE-ACETAMINOPHEN 5-325 MG PO TABS
1.0000 | ORAL_TABLET | Freq: Four times a day (QID) | ORAL | 0 refills | Status: AC | PRN
Start: 2019-12-06 — End: ?

## 2019-12-06 NOTE — Telephone Encounter (Signed)
Patient would like a Rx refill on Hydrocodone.  Cb# [id number].  Please advise.  Thank you.

## 2019-12-06 NOTE — Telephone Encounter (Signed)
This is a CB pt treating conservatively for closed displaced clavicle fracture. Requesting refill on hydrocodone last refill was 11/26/19 # 30 please advise.

## 2019-12-06 NOTE — Telephone Encounter (Signed)
Called pt and advised that rx has been sent to pharm.  

## 2019-12-06 NOTE — Telephone Encounter (Signed)
Refilled hydrocodone 

## 2019-12-17 ENCOUNTER — Encounter: Payer: Self-pay | Admitting: Orthopaedic Surgery

## 2019-12-17 ENCOUNTER — Ambulatory Visit (INDEPENDENT_AMBULATORY_CARE_PROVIDER_SITE_OTHER): Payer: Medicaid Other | Admitting: Orthopaedic Surgery

## 2019-12-17 ENCOUNTER — Ambulatory Visit: Payer: Self-pay

## 2019-12-17 DIAGNOSIS — S42001A Fracture of unspecified part of right clavicle, initial encounter for closed fracture: Secondary | ICD-10-CM | POA: Diagnosis not present

## 2019-12-17 MED ORDER — METHOCARBAMOL 500 MG PO TABS: 500.0000 mg | ORAL_TABLET | Freq: Four times a day (QID) | ORAL | 1 refills | Status: AC | PRN
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
## 2019-12-18 ENCOUNTER — Ambulatory Visit: Payer: Medicaid Other | Admitting: Orthopaedic Surgery

## 2020-01-14 ENCOUNTER — Encounter: Payer: Self-pay | Admitting: Orthopaedic Surgery

## 2020-01-14 ENCOUNTER — Ambulatory Visit (INDEPENDENT_AMBULATORY_CARE_PROVIDER_SITE_OTHER): Payer: Medicaid Other | Admitting: Orthopaedic Surgery

## 2020-01-14 ENCOUNTER — Ambulatory Visit (INDEPENDENT_AMBULATORY_CARE_PROVIDER_SITE_OTHER): Payer: Medicaid Other

## 2020-01-14 DIAGNOSIS — S42001A Fracture of unspecified part of right clavicle, initial encounter for closed fracture: Secondary | ICD-10-CM | POA: Diagnosis not present
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
## 2020-02-17 ENCOUNTER — Ambulatory Visit: Payer: Medicaid Other | Admitting: Orthopaedic Surgery

## 2020-03-05 IMAGING — US US MFM OB FOLLOW UP
1 series · 16 of 28 positions shown · non-contrast
Comparison: none

[Series 1: us mfm ob follow up · 16 of 74 slices shown]
[im 1/74]
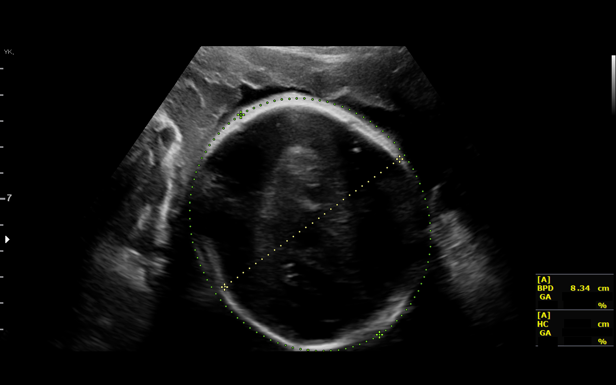
[im 6/74]
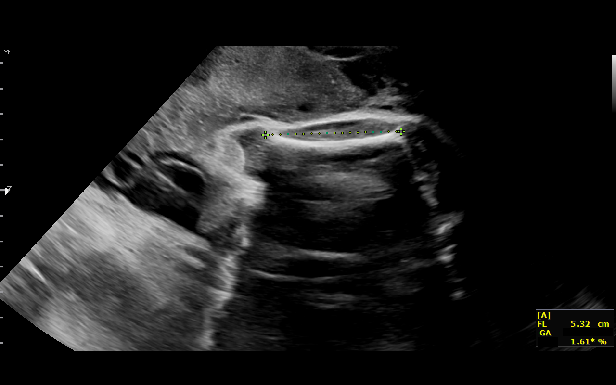
[im 11/74]
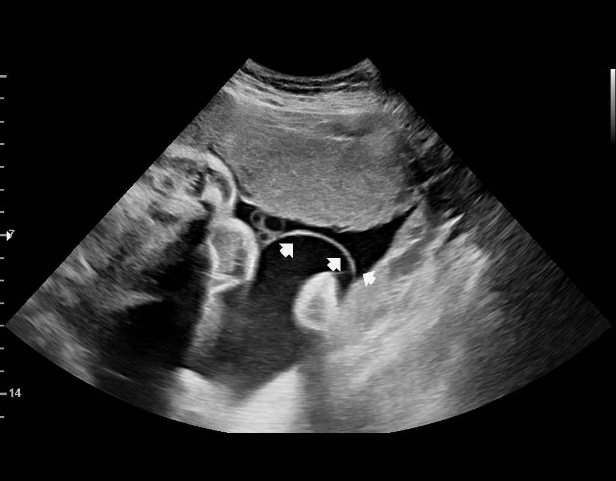
[im 17/74]
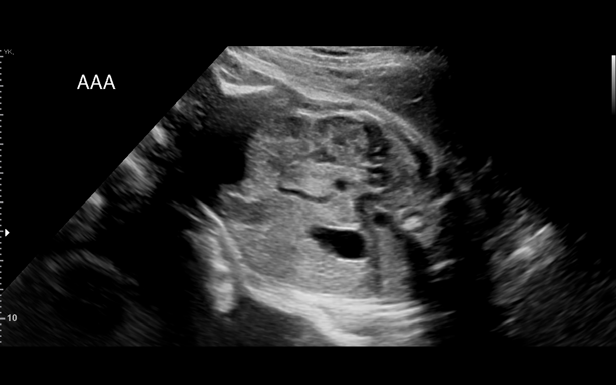
[im 19/74]
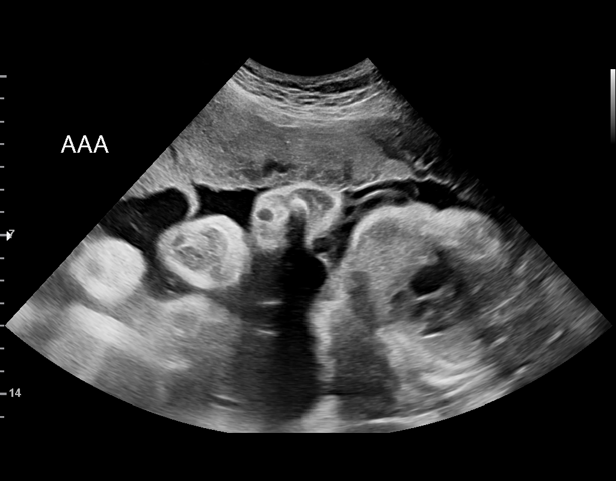
[im 25/74]
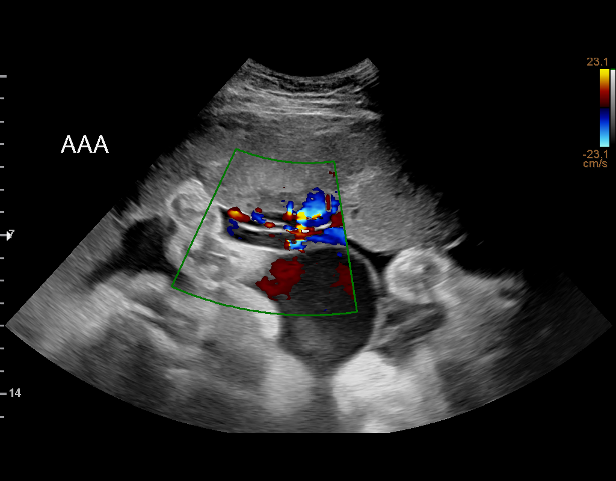
[im 30/74]
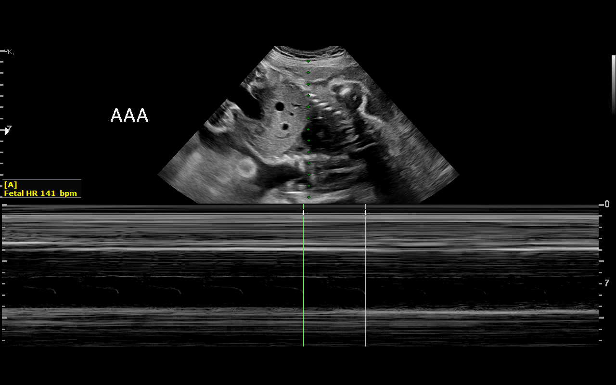
[im 36/74]
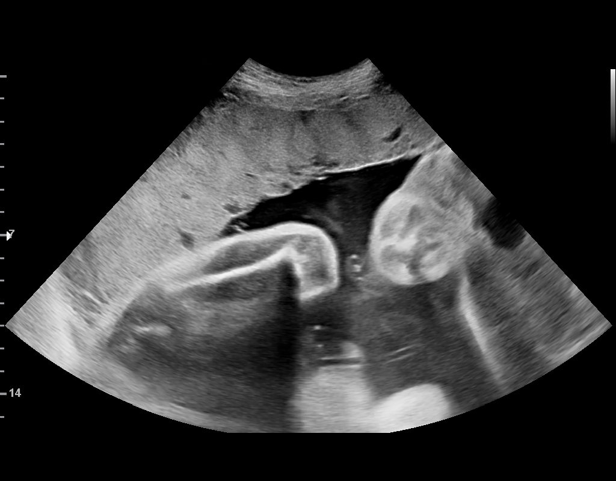
[im 38/74]
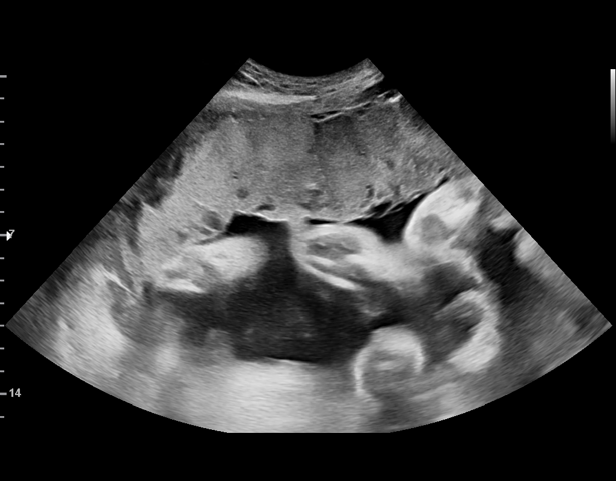
[im 44/74]
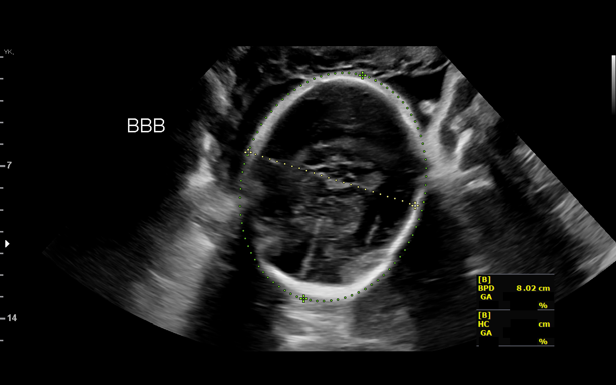
[im 49/74]
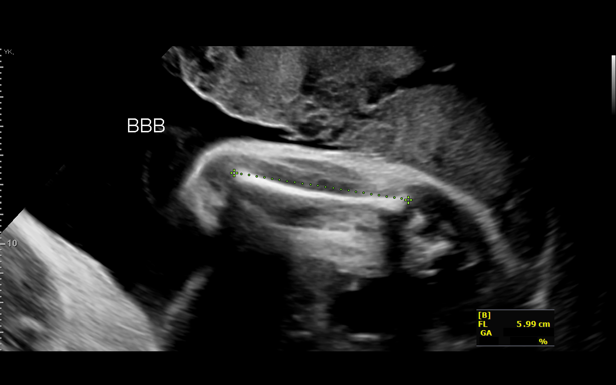
[im 55/74]
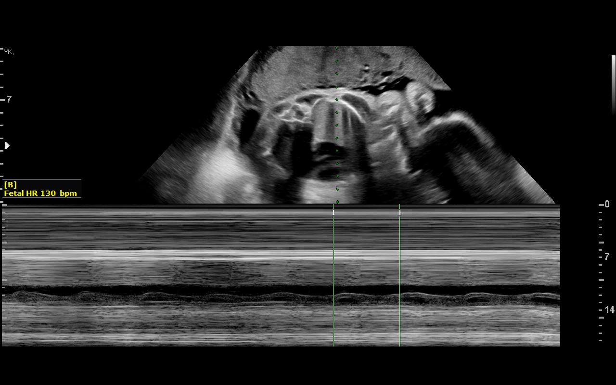
[im 57/74]
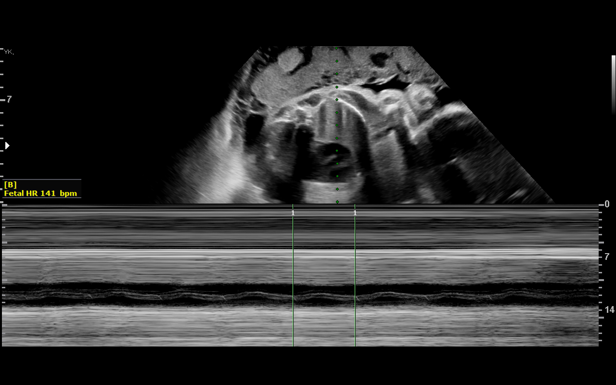
[im 63/74]
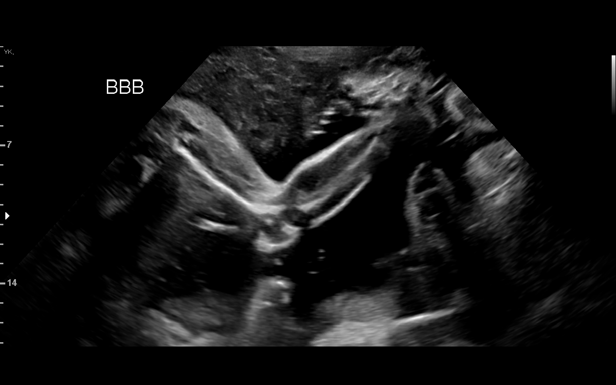
[im 68/74]
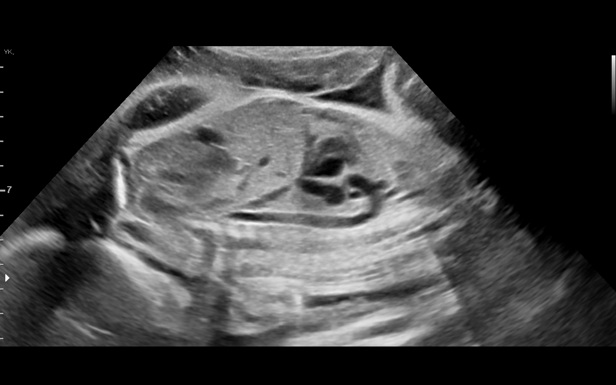
[im 74/74]
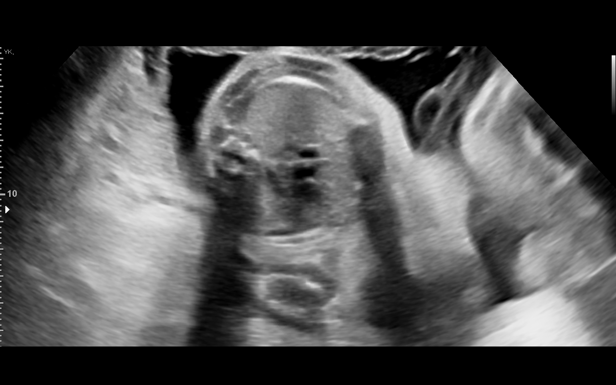

[16 of 28 positions shown; findings below may reference images not displayed]

GEST
 ----------------------------------------------------------------------

 ----------------------------------------------------------------------
Indications

  Twin pregnancy, di/di, second trimester
  30 weeks gestation of pregnancy
 ----------------------------------------------------------------------
Fetal Evaluation (Fetus A)

 Num Of Fetuses:         2
 Fetal Heart Rate(bpm):  130
 Cardiac Activity:       Observed
 Fetal Lie:              Maternal left side
 Presentation:           Cephalic
 Placenta:               Anterior
 P. Cord Insertion:      Visualized, central
 Membrane Desc:      Dividing Membrane seen

 Amniotic Fluid
 AFI FV:      Within normal limits

                             Largest Pocket(cm)

Biometry (Fetus A)

 BPD:      81.6  mm     G. Age:  32w 6d         93  %    CI:        73.93   %    70 - 86
                                                         FL/HC:      17.8   %    19.3 -
 HC:      301.4  mm     G. Age:  33w 3d         89  %    HC/AC:      1.08        0.96 -
 AC:      279.8  mm     G. Age:  32w 0d         85  %    FL/BPD:     65.8   %    71 - 87
 FL:       53.7  mm     G. Age:  28w 3d          2  %    FL/AC:      19.2   %    20 - 24

 Est. FW:    6141  gm    3 lb 12 oz      58  %     FW Discordancy        21  %
OB History
 Gravidity:    2         Term:   1        Prem:   0        SAB:   0
 TOP:          0       Ectopic:  0        Living: 1
Gestational Age (Fetus A)

 LMP:           30w 4d        Date:  03/30/18                 EDD:   01/04/19
 U/S Today:     31w 5d                                        EDD:   12/27/18
 Best:          30w 4d     Det. By:  LMP  (03/30/18)          EDD:   01/04/19
Anatomy (Fetus A)

 Cranium:               Previously seen        Aortic Arch:            Appears normal
 Cavum:                 Previously seen        Ductal Arch:            Appears normal
 Ventricles:            Previously seen        Diaphragm:              Previously seen
 Choroid Plexus:        Previously seen        Stomach:                Appears normal, left
                                                                       sided
 Cerebellum:            Previously seen        Abdomen:                Previously seen
 Posterior Fossa:       Previously seen        Abdominal Wall:         Previously seen
 Nuchal Fold:           Previously seen        Cord Vessels:           Previously seen
 Face:                  Orbits and profile     Kidneys:                Appear normal
                        previously seen
 Lips:                  Previously seen        Bladder:                Appears normal
 Palate:                Previously seen        Spine:                  Previousl Limited
                                                                       views appear normal
 Heart:                 Appears normal         Upper Extremities:      Previously seen
                        (4CH, axis, and
                        situs)
 RVOT:                  Appears normal         Lower Extremities:      Previously seen
 LVOT:                  Appears normal

 Other:  Fetus appears to be a male.

Fetal Evaluation (Fetus B)

 Num Of Fetuses:         2
 Fetal Heart Rate(bpm):  130
 Cardiac Activity:       Observed
 Fetal Lie:              Upper right
 Presentation:           Breech
 Placenta:               Anterior
 P. Cord Insertion:      Visualized, central
 Membrane Desc:      Dividing Membrane seen

 Amniotic Fluid
 AFI FV:      Subjectively increased

                             Largest Pocket(cm)

Biometry (Fetus B)

 BPD:      80.4  mm     G. Age:  32w 2d         86  %    CI:        73.57   %    70 - 86
                                                         FL/HC:      20.4   %    19.3 -
 HC:      297.8  mm     G. Age:  33w 0d         80  %    HC/AC:      0.98        0.96 -
 AC:      304.5  mm     G. Age:  34w 3d       > 99  %    FL/BPD:     75.7   %    71 - 87
 FL:       60.9  mm     G. Age:  31w 4d         66  %    FL/AC:      20.0   %    20 - 24

 Est. FW:    4683  gm    4 lb 12 oz      99  %     FW Discordancy     0 \ 21 %
Gestational Age (Fetus B)

 LMP:           30w 4d        Date:  03/30/18                 EDD:   01/04/19
 U/S Today:     32w 6d                                        EDD:   12/19/18
 Best:          30w 4d     Det. By:  LMP  (03/30/18)          EDD:   01/04/19
Anatomy (Fetus B)

 Cranium:               Previously seen        LVOT:                   Appears normal
 Cavum:                 Previously seen        Aortic Arch:            Previously seen
 Ventricles:            Previously seen        Ductal Arch:            Not well visualized
 Choroid Plexus:        Previously seen        Diaphragm:              Previously seen
 Cerebellum:            Previously seen        Stomach:                Appears normal, left
                                                                       sided
 Posterior Fossa:       Previously seen        Abdomen:                Previously seen
 Nuchal Fold:           Previously seen        Abdominal Wall:         Previously seen
 Face:                  Orbits previously      Cord Vessels:           Previously seen
                        seen
 Lips:                  Previously seen        Kidneys:                Appear normal
 Palate:                Not well visualized    Bladder:                Appears normal
 Thoracic:              Previously seen        Spine:                  Previously seen
 Heart:                 Previously seen        Upper Extremities:      Previously seen
 RVOT:                  Not well visualized    Lower Extremities:      Previously seen
Cervix Uterus Adnexa

 Cervix
 Not adaquately visualized
Impression

 Dichorionic-diamniotic twin pregnancy.
 Twin A: Maternal left, cephalic presentation, anterior
 placenta. Fetal growth is appropriate for gestational age.
 Amniotic fluid is normal and good fetal activity is seen.

 Twin B: Upper fetus and maternal right, breech presentation,
 anterior placenta. The estimated fetal weight is at the 99th
 percentile. Amniotic fluid is slightly increased and good fetal
 activity is seen.

 Growth discordancy: 21% (no evidence of growth restriction
 in twin A).

 She does not have gestational diabetes and her blood
 pressures have been normal at prenatal visits.

 BP at our office: 103/64 mm Hg.
Recommendations

 -An appointment was made for her to return in 4 weeks for
 fetal growth assessment.
                 Kinan, Nana Sy

## 2020-04-11 IMAGING — US US MFM OB FOLLOW-UP
1 series · 12 of 28 positions shown · non-contrast
Comparison: none

[Series 1: us mfm ob follow-up · 65 acquisitions, 12 frames shown]
[im 3/65]
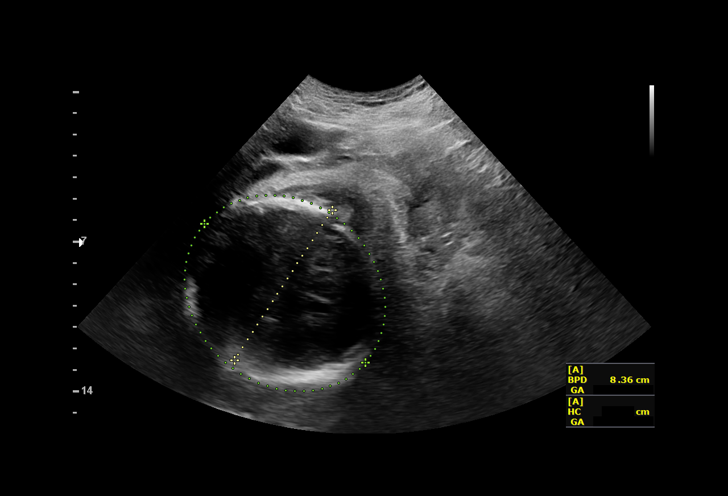
[im 8/65]
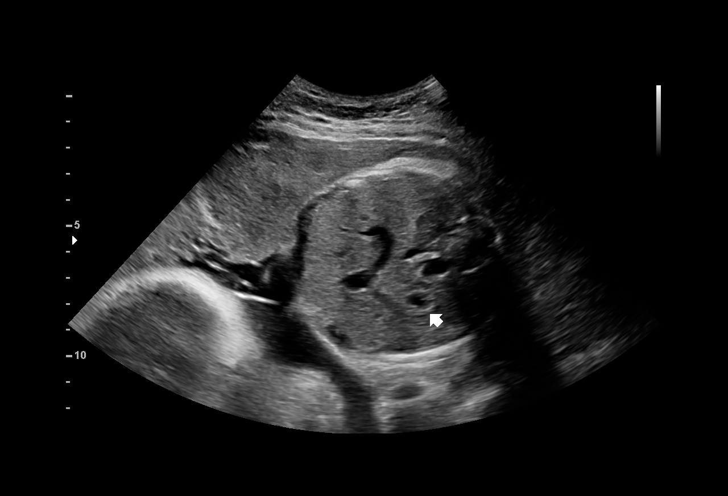
[im 12/65]
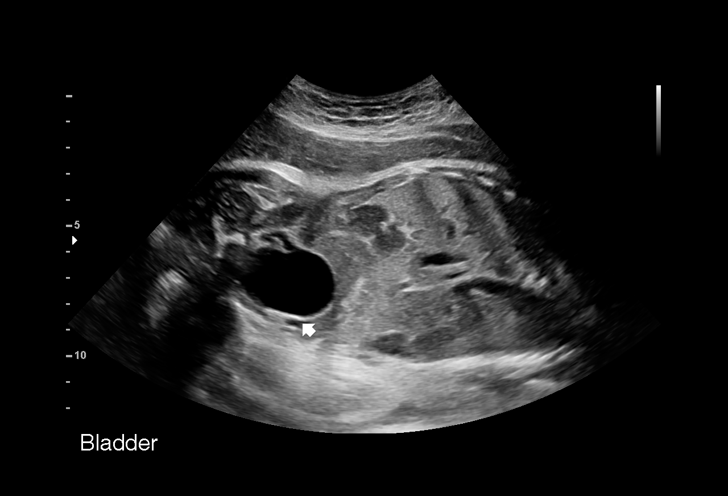
[im 19/65]
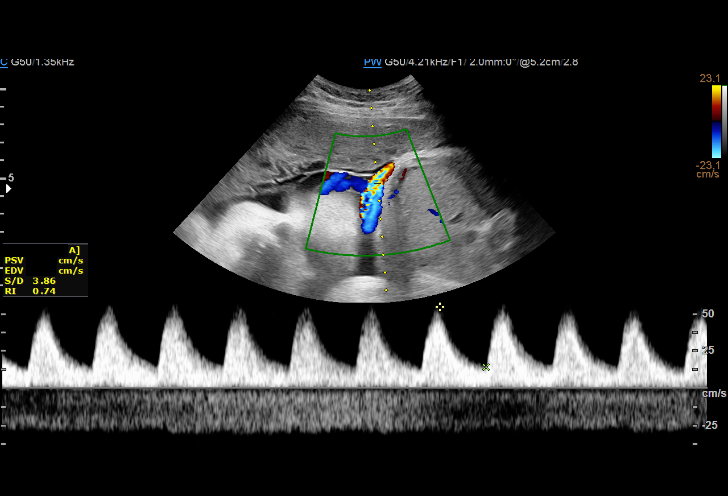
[im 24/65]
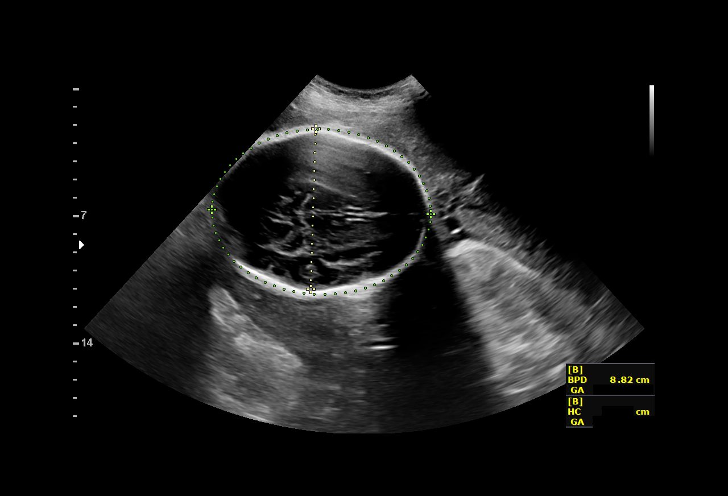
[im 29/65]
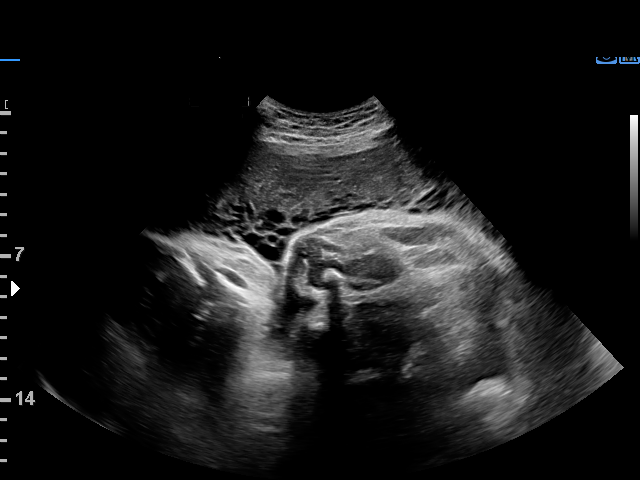
[im 36/65]
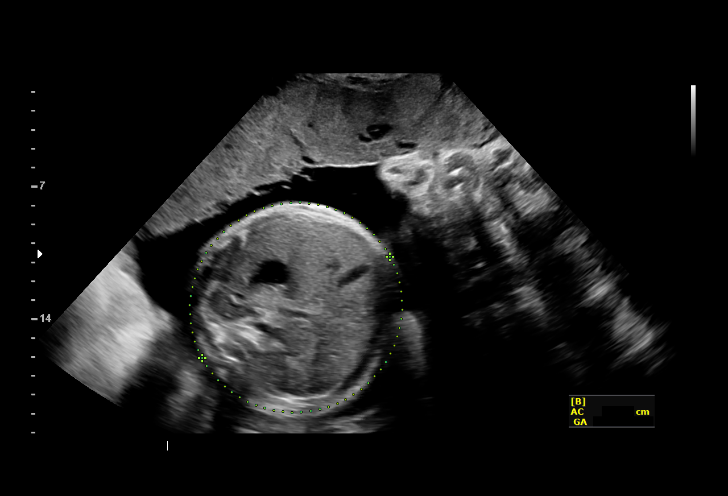
[im 41/65]
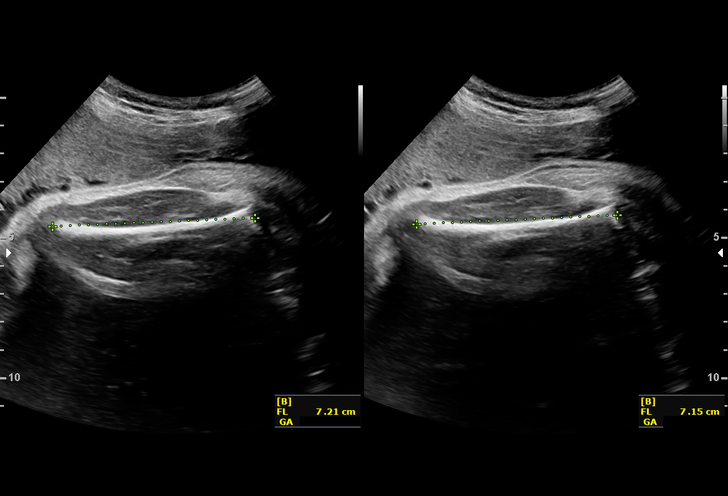
[im 46/65]
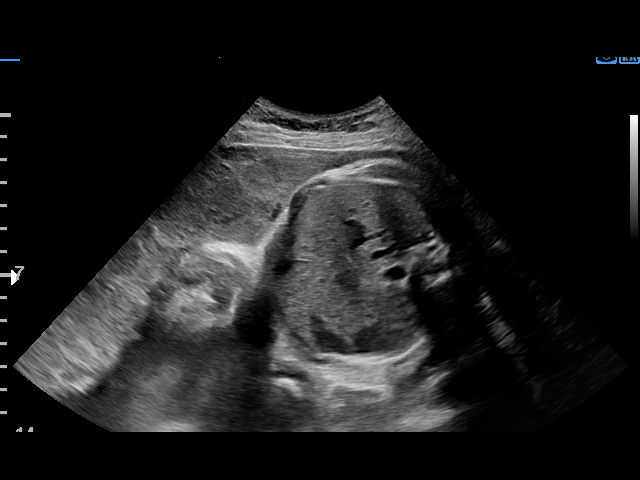
[im 53/65]
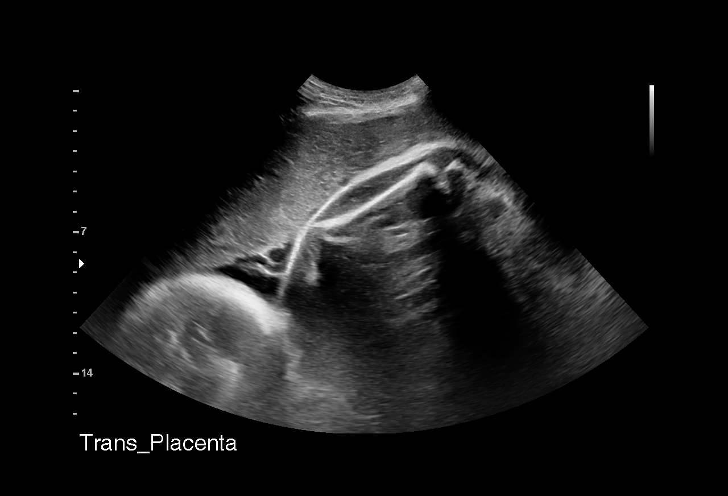
[im 57/65]
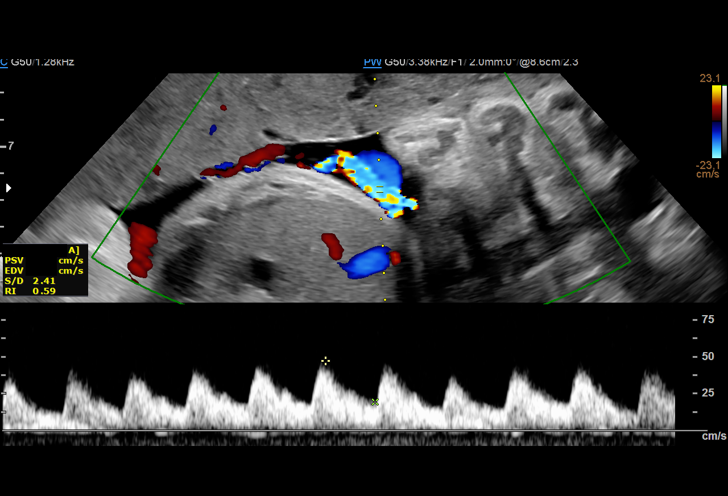
[im 62/65]
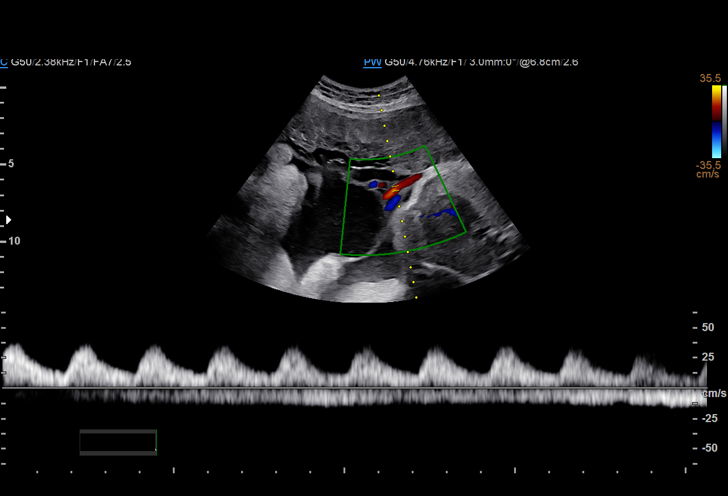

[12 of 28 positions shown; findings below may reference images not displayed]

GEST
     ADDL GESTATION
 ----------------------------------------------------------------------

 ----------------------------------------------------------------------
Indications

  Encounter for other antenatal screening
  follow-up
  Twin pregnancy, di/di, third trimester
  Maternal care for known or suspected poor
  fetal growth, third trimester, fetus 1 IUGR
  35 weeks gestation of pregnancy
 ----------------------------------------------------------------------
Vital Signs

 BMI:
Fetal Evaluation (Fetus A)

 Num Of Fetuses:         2
 Fetal Heart Rate(bpm):  123
 Cardiac Activity:       Observed
 Fetal Lie:              Maternal left side
 Presentation:           Cephalic
 Placenta:               Anterior
 P. Cord Insertion:      Previously Visualized
 Membrane Desc:      Dividing Membrane seen
 Amniotic Fluid
 AFI FV:      Within normal limits

                             Largest Pocket(cm)

Biophysical Evaluation (Fetus A)

 Amniotic F.V:   Within normal limits       F. Tone:        Not Observed
 F. Movement:    Observed                   Score:          [DATE]
 F. Breathing:   Not Observed
Biometry (Fetus A)

 BPD:      80.5  mm     G. Age:  32w 2d          1  %    CI:        78.98   %    70 - 86
                                                         FL/HC:      22.9   %    20.1 -
 HC:      286.4  mm     G. Age:  31w 3d          0  %    HC/AC:      1.08        0.93 -
 AC:      266.1  mm     G. Age:  30w 5d          0  %    FL/BPD:     81.5   %    71 - 87
 FL:       65.6  mm     G. Age:  33w 6d          6  %    FL/AC:      24.7   %    20 - 24
 HUM:      54.8  mm     G. Age:  31w 6d        < 5  %

 Est. FW:    1718  gm      4 lb 2 oz      0  %     FW Discordancy        45  %
OB History

 Gravidity:    2         Term:   1        Prem:   0        SAB:   0
 TOP:          0       Ectopic:  0        Living: 1
Gestational Age (Fetus A)

 LMP:           35w 6d        Date:  03/30/18                 EDD:   01/04/19
 U/S Today:     32w 1d                                        EDD:   01/30/19
 Best:          35w 6d     Det. By:  LMP  (03/30/18)          EDD:   01/04/19
Doppler - Fetal Vessels (Fetus A)

 Umbilical Artery
  S/D     %tile                                            ADFV    RDFV
 3.68       96                                                No      No

Fetal Evaluation (Fetus B)

 Num Of Fetuses:         2
 Fetal Heart Rate(bpm):  118
 Cardiac Activity:       Observed
 Fetal Lie:              Upper Fetus
 Presentation:           Transverse, head to maternal right
 Placenta:               Anterior
 P. Cord Insertion:      Previously Visualized
 Membrane Desc:      Dividing Membrane seen

 Amniotic Fluid
 AFI FV:      Within normal limits

                             Largest Pocket(cm)

Biophysical Evaluation (Fetus B)

 Amniotic F.V:   Within normal limits       F. Tone:        Observed
 F. Movement:    Observed                   Score:          [DATE]
 F. Breathing:   Observed
Biometry (Fetus B)

 BPD:      88.3  mm     G. Age:  35w 5d         53  %    CI:        70.53   %    70 - 86
                                                         FL/HC:      21.4   %    20.1 -
 HC:      335.2  mm     G. Age:  38w 3d         79  %    HC/AC:      0.96        0.93 -
 AC:      350.1  mm     G. Age:  38w 6d       > 99  %    FL/BPD:     81.1   %    71 - 87
 FL:       71.6  mm     G. Age:  36w 5d         67  %    FL/AC:      20.5   %    20 - 24
 HUM:      62.4  mm     G. Age:  36w 1d         69  %

 Est. FW:    2222  gm      7 lb 6 oz     95  %     FW Discordancy     0 \ 45 %
Gestational Age (Fetus B)

 LMP:           35w 6d        Date:  03/30/18                 EDD:   01/04/19
 U/S Today:     37w 3d                                        EDD:   12/24/18
 Best:          35w 6d     Det. By:  LMP  (03/30/18)          EDD:   01/04/19
Doppler - Fetal Vessels (Fetus B)

 Umbilical Artery
  S/D     %tile                                            ADFV    RDFV
 2.42       51                                                No      No

Cervix Uterus Adnexa

 Cervix
 Not visualized (advanced GA >59wks)
Comments

 This patient was seen for follow-up growth scan due to a set
 of dichorionic, diamniotic twins.  The patient denies any
 problems since her last exam and reports feeling vigorous
 fetal movements throughout the day.  Based on her prior
 ultrasound exams, there has always been a growth
 discordance of about 1 pound between twin A and twin B.

 On today's exam, the overall EFW for twin A measured 4
 pounds 2 ounces.  There was normal amniotic fluid noted
 around twin A.  The fetal biometry measurements for twin A
 were difficult to obtain due to the fetal position.

 The overall EFW obtained for twin B was over 7 pounds.
 There was normal amniotic fluid noted around twin B.

 Doppler studies of the umbilical arteries performed today
 showed an elevated S/D ratio of 3.68 for twin A and a normal
 S/D ratio of 2.4 for twin B.

 A biophysical profile performed for twin A was [DATE]
 (there was -2 for absent fetal tone and -2 for absent fetal
 breathing movements).  The biophysical profile was [DATE]
 for twin B.

 Due to the significant growth discordance noted between the
 two fetuses and due to the biophysical profile obtained for
 twin A today, the patient was sent to the hospital for
 admission immediately following today's exam to receive a
 complete course of steroids.  The patient should be placed
 on the fetal heart rate monitor for a few hours.  If the fetal
 statuses are reassuring, she may be taken off the monitor.
 She should have an NST per shift while hospitalized. She
 should receive the second dose of steroids tomorrow
 morning.  Delivery may be considered tomorrow afternoon,
 about 6 to 8 hours after she receives the second dose of
 steroids.

 The fetuses are in the vertex / breech presentation with the
 presenting twin being the smaller fetus.  Due to this finding,
 the patient was advised that she will most likely require a
 cesarean delivery.  She understands that there is a possibility
 that the babies may require a NICU admission.

## 2021-04-16 DIAGNOSIS — T63481A Toxic effect of venom of other arthropod, accidental (unintentional), initial encounter: Secondary | ICD-10-CM | POA: Diagnosis not present

## 2021-04-19 ENCOUNTER — Other Ambulatory Visit: Payer: Self-pay

## 2021-04-19 ENCOUNTER — Encounter (HOSPITAL_BASED_OUTPATIENT_CLINIC_OR_DEPARTMENT_OTHER): Payer: Self-pay

## 2021-04-19 ENCOUNTER — Emergency Department (HOSPITAL_BASED_OUTPATIENT_CLINIC_OR_DEPARTMENT_OTHER)
Admission: EM | Admit: 2021-04-19 | Discharge: 2021-04-19 | Disposition: A | Discharge status: Home / Self Care | Payer: Medicaid Other | Attending: Emergency Medicine | Admitting: Emergency Medicine

## 2021-04-19 DIAGNOSIS — S80821A Blister (nonthermal), right lower leg, initial encounter: Secondary | ICD-10-CM | POA: Insufficient documentation

## 2021-04-19 DIAGNOSIS — L231 Allergic contact dermatitis due to adhesives: Secondary | ICD-10-CM

## 2021-04-19 DIAGNOSIS — X58XXXA Exposure to other specified factors, initial encounter: Secondary | ICD-10-CM | POA: Insufficient documentation

## 2021-04-19 DIAGNOSIS — R238 Other skin changes: Secondary | ICD-10-CM | POA: Insufficient documentation

## 2021-04-19 DIAGNOSIS — L139 Bullous disorder, unspecified: Secondary | ICD-10-CM | POA: Diagnosis not present

## 2021-04-19 LAB — COMPREHENSIVE METABOLIC PANEL
ALT: 12 U/L (ref 0–44)
AST: 19 U/L (ref 15–41)
Albumin: 4 g/dL (ref 3.5–5.0)
Alkaline Phosphatase: 57 U/L (ref 38–126)
Anion gap: 10 (ref 5–15)
BUN: 12 mg/dL (ref 6–20)
CO2: 23 mmol/L (ref 22–32)
Calcium: 8.9 mg/dL (ref 8.9–10.3)
Chloride: 103 mmol/L (ref 98–111)
Creatinine, Ser: 0.64 mg/dL (ref 0.44–1.00)
GFR, Estimated: >60 mL/min (ref >60)
Glucose, Bld: 103 mg/dL — ABNORMAL HIGH (ref 70–99)
Potassium: 2.7 mmol/L — CL (ref 3.5–5.1)
Sodium: 136 mmol/L (ref 135–145)
Total Bilirubin: 1 mg/dL (ref 0.3–1.2)
Total Protein: 7.8 g/dL (ref 6.5–8.1)

## 2021-04-19 LAB — CBC WITH DIFFERENTIAL/PLATELET
Abs Immature Granulocytes: 0.03 10*3/uL (ref 0.00–0.07)
Basophils Absolute: 0 10*3/uL (ref 0.0–0.1)
Basophils Relative: 0 %
Eosinophils Absolute: 0 10*3/uL (ref 0.0–0.5)
Eosinophils Relative: 1 %
HCT: 33.6 % — ABNORMAL LOW (ref 36.0–46.0)
Hemoglobin: 10.3 g/dL — ABNORMAL LOW (ref 12.0–15.0)
Immature Granulocytes: 1 %
Lymphocytes Relative: 28 %
Lymphs Abs: 1.8 10*3/uL (ref 0.7–4.0)
MCH: 22.6 pg — ABNORMAL LOW (ref 26.0–34.0)
MCHC: 30.7 g/dL (ref 30.0–36.0)
MCV: 73.7 fL — ABNORMAL LOW (ref 80.0–100.0)
Monocytes Absolute: 0.9 10*3/uL (ref 0.1–1.0)
Monocytes Relative: 13 %
Neutro Abs: 3.6 10*3/uL (ref 1.7–7.7)
Neutrophils Relative %: 57 %
Platelets: 394 10*3/uL (ref 150–400)
RBC: 4.56 MIL/uL (ref 3.87–5.11)
RDW: 16.9 % — ABNORMAL HIGH (ref 11.5–15.5)
WBC: 6.3 10*3/uL (ref 4.0–10.5)
nRBC: 0 % (ref 0.0–0.2)

## 2021-04-19 MED ORDER — POTASSIUM CHLORIDE CRYS ER 20 MEQ PO TBCR
40.0000 meq | EXTENDED_RELEASE_TABLET | Freq: Once | ORAL | Status: AC
  Administered 2021-04-19: 40 meq via ORAL
  Filled 2021-04-19: qty 2

## 2021-04-19 MED ORDER — TRIAMCINOLONE ACETONIDE 0.1 % EX CREA: 1.0000 "application " | TOPICAL_CREAM | Freq: Two times a day (BID) | CUTANEOUS | 0 refills | Status: AC

## 2021-04-19 MED ORDER — DOXYCYCLINE HYCLATE 100 MG PO CAPS: 100.0000 mg | ORAL_CAPSULE | Freq: Two times a day (BID) | ORAL | 0 refills | Status: AC
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
## 2021-04-19 NOTE — Discharge Instructions (Addendum)
Get help right away if: Your symptoms get worse. You feel very sleepy. You develop vomiting or diarrhea that persists. You notice red streaks coming from the infected area. Your red area gets larger or turns dark in color. 

## 2021-04-19 NOTE — ED Provider Notes (Signed)
[facility] Provider Note   CSN: [id number] Arrival date & time: 04/19/21  1811     History  Chief Complaint  Patient presents with   Leg Injury    Danielle Crawford is a 26 y.o. female who presents emergency department for evaluation of right leg infection.  History is given by the patient and her mother at bedside.  Patient states that last Friday she had a small bump on the bottom of her leg which she believed was a pimple or spider bite.  She was seen in urgent care in [place] where they lanced the pimple, took cultures and started her on prednisone..  Patient had the area keep covered with a bandage.  Over the past few days she has had worsening of redness and worsening of blisters around the area.  Patient states that it is extremely itchy, pain is less severe than it was when she was seen in the urgent care the other day.  Patient denies fevers, chills, body ache.  HPI     Home Medications Prior to Admission medications   Medication Sig Start Date End Date Taking? Authorizing Provider  citalopram (CELEXA) 20 MG tablet Take 1 tablet (20 mg total) by mouth daily. 07/11/19   Truett Mainland, DO  docusate sodium (COLACE) 100 MG capsule Take 1 capsule (100 mg total) by mouth 2 (two) times daily. 12/10/18   Laury Deep, CNM  ferrous sulfate 325 (65 FE) MG tablet Take 1 tablet (325 mg total) by mouth 2 (two) times daily with a meal. 12/10/18   Laury Deep, CNM  HYDROcodone-acetaminophen (NORCO/VICODIN) 5-325 MG tablet Take 1 tablet by mouth every 6 (six) hours as needed for moderate pain. 12/06/19   Persons, Bevely Palmer, PA  methocarbamol (ROBAXIN) 500 MG tablet Take 1 tablet (500 mg total) by mouth every 6 (six) hours as needed. 12/17/19   Mcarthur Rossetti, MD  metoCLOPramide (REGLAN) 10 MG tablet Take 1 tablet (10 mg total) by mouth 3 (three) times daily before meals. 04/15/19 05/15/19  Truett Mainland, DO  naproxen (NAPROSYN) 375 MG tablet Take  1 tablet (375 mg total) by mouth 2 (two) times daily with a meal. 11/25/19   Dorie Rank, MD  Prenatal Vit-Fe Fumarate-FA (PREPLUS) 27-1 MG TABS Take 1 tablet by mouth daily. 04/15/19   Truett Mainland, DO  simethicone (MYLICON) 80 MG chewable tablet Chew 1 tablet (80 mg total) by mouth 3 (three) times daily after meals. 12/10/18   Laury Deep, CNM      Allergies    Patient has no known allergies.    Review of Systems   Review of Systems  Physical Exam Updated Vital Signs BP 112/74 (BP Location: Right Arm)    Pulse 82    Temp 99 F (37.2 C) (Oral)    Resp 18    Ht 5\' 3"  (1.6 m)    Wt 57.6 kg    LMP 03/22/2021    SpO2 100%    BMI 22.50 kg/m  Physical Exam Vitals and nursing note reviewed.  Constitutional:      General: She is not in acute distress.    Appearance: She is well-developed. She is not diaphoretic.  HENT:     Head: Normocephalic and atraumatic.     Right Ear: External ear normal.     Left Ear: External ear normal.     Nose: Nose normal.     Mouth/Throat:     Mouth: Mucous membranes are moist.  Eyes:     General: No scleral icterus.    Conjunctiva/sclera: Conjunctivae normal.  Cardiovascular:     Rate and Rhythm: Normal rate and regular rhythm.     Heart sounds: Normal heart sounds. No murmur heard.   No friction rub. No gallop.  Pulmonary:     Effort: Pulmonary effort is normal. No respiratory distress.     Breath sounds: Normal breath sounds.  Abdominal:     General: Bowel sounds are normal. There is no distension.     Palpations: Abdomen is soft. There is no mass.     Tenderness: There is no abdominal tenderness. There is no guarding.  Musculoskeletal:     Cervical back: Normal range of motion.  Skin:    General: Skin is warm and dry.     Comments: Right lower extremity with large central bulla.  No significant tenderness.  There is a region of well demarcated erythema with multiple small confluent and singular erythematous papules with also without  tenderness.  No lymphangitis.  Neurological:     Mental Status: She is alert and oriented to person, place, and time.  Psychiatric:        Behavior: Behavior normal.      ED Results / Procedures / Treatments   Labs (all labs ordered are listed, but only abnormal results are displayed) Labs Reviewed  COMPREHENSIVE METABOLIC PANEL - Abnormal; Notable for the following components:      Result Value   Potassium 2.7 (*)    Glucose, Bld 103 (*)    All other components within normal limits  CBC WITH DIFFERENTIAL/PLATELET - Abnormal; Notable for the following components:   Hemoglobin 10.3 (*)    HCT 33.6 (*)    MCV 73.7 (*)    MCH 22.6 (*)    RDW 16.9 (*)    All other components within normal limits    EKG None  Radiology No results found.  Procedures Procedures    Medications Ordered in ED Medications - No data to display  ED Course/ Medical Decision Making/ A&P                           Medical Decision Making Amount and/or Complexity of Data Reviewed Labs: ordered.  Risk Prescription drug management.    Patient here with leg lesion. DDX includes infection, cellulitis, contact dermatitis. I suspect a combination of both. Patient will be treated with doxycycline and topical kenalog. Discussed op f/u and return precautions. Final Clinical Impression(s) / ED Diagnoses Final diagnoses:  None    Rx / DC Orders ED Discharge Orders     None         Margarita Mail, PA-C 04/20/21 0000    Sherwood Gambler, MD 04/20/21 [phone]

## 2021-04-19 NOTE — ED Notes (Signed)
Pt. Believes her potassium is low due to nausea and vomiting 6-7 times yesterday that was likely food related.  No vomiting able to keep food and liquids down today.

## 2023-04-26 ENCOUNTER — Other Ambulatory Visit: Payer: Self-pay

## 2023-04-26 ENCOUNTER — Encounter (HOSPITAL_BASED_OUTPATIENT_CLINIC_OR_DEPARTMENT_OTHER): Payer: Self-pay | Admitting: Radiology

## 2023-04-26 ENCOUNTER — Emergency Department (HOSPITAL_BASED_OUTPATIENT_CLINIC_OR_DEPARTMENT_OTHER)
Admission: EM | Admit: 2023-04-26 | Discharge: 2023-04-26 | Disposition: A | Discharge status: Home / Self Care | Payer: Medicaid Other | Attending: Emergency Medicine | Admitting: Emergency Medicine

## 2023-04-26 DIAGNOSIS — M79604 Pain in right leg: Secondary | ICD-10-CM | POA: Insufficient documentation

## 2023-04-26 NOTE — ED Triage Notes (Addendum)
 Pt states that she had a wound on her a few years ago and today in the same area there is a bruise that the patient states just popped up out of nowhere and now her entire leg is tingling. PT concerned for a blood clot or that it may have something to do with the wound that was once there. Pt has a small bruise to the side of her lower leg. Denies injury.

## 2023-04-26 NOTE — ED Notes (Signed)
 Pt waiting by vending machines.

## 2023-04-26 NOTE — Discharge Instructions (Addendum)
It was a pleasure caring for you today.  Please follow-up with your primary care appointment tomorrow.  Seek emergency care if experiencing any new or worsening symptoms.

## 2023-04-26 NOTE — ED Provider Notes (Signed)
 [facility] Provider Note   CSN: [id number] Arrival date & time: 04/26/23  1853     History  Chief Complaint  Patient presents with   Leg Problem    Danielle Crawford is a 28 y.o. female with PMHx migraines who presents to ED concerned for right leg pain and hematoma of right anterolateral shin. Patient does not remember trauma to this area. Patient stating that it feels like the pain is radiating up from this hematoma. Patient would like to make sure that this is not a blood clot.  Denies fever, chest pain, dyspnea, cough, nausea, vomiting, diarrhea.  HPI     Home Medications Prior to Admission medications   Medication Sig Start Date End Date Taking? Authorizing Provider  citalopram  (CELEXA ) 20 MG tablet Take 1 tablet (20 mg total) by mouth daily. 07/11/19   Stinson, Jacob J, DO  docusate sodium  (COLACE) 100 MG capsule Take 1 capsule (100 mg total) by mouth 2 (two) times daily. 12/10/18   Dawson, Rolitta, CNM  doxycycline  (VIBRAMYCIN ) 100 MG capsule Take 1 capsule (100 mg total) by mouth 2 (two) times daily. One po bid x 7 days 04/19/21   Harris, Abigail, PA-C  ferrous sulfate  325 (65 FE) MG tablet Take 1 tablet (325 mg total) by mouth 2 (two) times daily with a meal. 12/10/18   Letha Renshaw, CNM  HYDROcodone -acetaminophen  (NORCO/VICODIN) 5-325 MG tablet Take 1 tablet by mouth every 6 (six) hours as needed for moderate pain. 12/06/19   Persons, Ronal Dragon, PA  methocarbamol  (ROBAXIN ) 500 MG tablet Take 1 tablet (500 mg total) by mouth every 6 (six) hours as needed. 12/17/19   Vernetta Lonni GRADE, MD  metoCLOPramide  (REGLAN ) 10 MG tablet Take 1 tablet (10 mg total) by mouth 3 (three) times daily before meals. 04/15/19 05/15/19  Stinson, Jacob J, DO  naproxen  (NAPROSYN ) 375 MG tablet Take 1 tablet (375 mg total) by mouth 2 (two) times daily with a meal. 11/25/19   Randol Simmonds, MD  Prenatal Vit-Fe Fumarate-FA (PREPLUS) 27-1 MG TABS Take 1 tablet  by mouth daily. 04/15/19   Stinson, Jacob J, DO  simethicone  (MYLICON) 80 MG chewable tablet Chew 1 tablet (80 mg total) by mouth 3 (three) times daily after meals. 12/10/18   Letha Renshaw, CNM  triamcinolone  cream (KENALOG ) 0.1 % Apply 1 application topically 2 (two) times daily. 04/19/21   Harris, Abigail, PA-C      Allergies    Patient has no known allergies.    Review of Systems   Review of Systems  Musculoskeletal:        Leg pain    Physical Exam Updated Vital Signs BP 120/63 (BP Location: Left Arm)   Pulse 87   Temp 98.6 F (37 C)   Resp 18   Ht 5' 3 (1.6 m)   Wt 56.7 kg   SpO2 100%   BMI 22.14 kg/m  Physical Exam Vitals and nursing note reviewed.  Constitutional:      General: She is not in acute distress.    Appearance: She is not ill-appearing or toxic-appearing.  HENT:     Head: Normocephalic and atraumatic.     Mouth/Throat:     Mouth: Mucous membranes are moist.  Eyes:     General: No scleral icterus.       Right eye: No discharge.        Left eye: No discharge.     Conjunctiva/sclera: Conjunctivae normal.  Cardiovascular:  Rate and Rhythm: Normal rate and regular rhythm.     Pulses: Normal pulses.     Heart sounds: Normal heart sounds. No murmur heard. Pulmonary:     Effort: Pulmonary effort is normal. No respiratory distress.     Breath sounds: Normal breath sounds. No wheezing, rhonchi or rales.  Abdominal:     General: Abdomen is flat.  Musculoskeletal:     Right lower leg: No edema.     Left lower leg: No edema.  Skin:    General: Skin is warm and dry.     Findings: No rash.     Comments: 2cm hematoma on anterolateral right shin. No surrounding erythema or increased warmth. +2 pedal pulse. Sensation to light touch intact. Area non-tense.  Neurological:     General: No focal deficit present.     Mental Status: She is alert and oriented to person, place, and time. Mental status is at baseline.  Psychiatric:        Mood and Affect: Mood  normal.        Behavior: Behavior normal.     ED Results / Procedures / Treatments   Labs (all labs ordered are listed, but only abnormal results are displayed) Labs Reviewed - No data to display  EKG None  Radiology No results found.  Procedures Procedures    Medications Ordered in ED Medications - No data to display  ED Course/ Medical Decision Making/ A&P                                 Medical Decision Making  This patient presents to the ED for concern of leg pain, this involves an extensive number of treatment options, and is a complaint that carries with it a high risk of complications and morbidity.  The differential diagnosis includes DVT/PE, hematoma, electrolyte abnormality, compartment syndrome, etc.    Co morbidities that complicate the patient evaluation  none   Additional history obtained:  PCP with [facility]   Problem List / ED Course / Critical interventions / Medication management  Patient presents to ED for DVT workup.  Patient was concern for a small hematoma on her anterior lateral right shin.  Patient stating that pain is radiating up her leg from this hematoma.  Does not remember any trauma to this area.  Denies any respiratory complaint or infectious symptoms. Physical exam reassuring. Patient afebrile with stable vitals.  Wells DVT criteria score: 0 I am not currently convinced that this area is a DVT. It appears to be a hematoma. I offered patient DVT US  here in ED.  Patient stating that is late and she needs to go home to her 3 children.  Offered to schedule patient appointment for DVT US  in the morning at [facility] or [facility].  Patient declining stating that she has a appointment with her primary care provider tomorrow at 1PM and would rather follow-up during this appointment.  Offered blood work for patient.  Patient declining stating that she would also like to get these labs with her PCP. Answered all of  patient's questions.  I have reviewed the patients home medicines and have made adjustments as needed Patient afebrile with stable vitals.  Provided with return precautions.  Discharged in good condition.   Social Determinants of Health:  none          Final Clinical Impression(s) / ED Diagnoses Final diagnoses:  Leg  pain, anterior, right    Rx / DC Orders ED Discharge Orders     None         Hoy Nidia JULIANNA DEVONNA 04/26/23 2236    Darra Fonda MATSU, MD 04/27/23 [phone]

## 2023-04-27 ENCOUNTER — Other Ambulatory Visit: Payer: Self-pay | Admitting: *Deleted

## 2023-04-27 ENCOUNTER — Ambulatory Visit
Admission: RE | Admit: 2023-04-27 | Discharge: 2023-04-27 | Disposition: A | Discharge status: Home / Self Care | Payer: Medicaid Other | Source: Ambulatory Visit | Attending: *Deleted | Admitting: *Deleted

## 2023-04-27 DIAGNOSIS — M79605 Pain in left leg: Secondary | ICD-10-CM
